# Patient Record
Sex: Female | Born: 1980 | Race: White | Hispanic: No | Marital: Married | Smoking: Former smoker
Health system: Southern US, Community
[De-identification: ages and names within clinical notes are randomized; demographics above are authoritative.]

## PROBLEM LIST (undated history)

## (undated) DIAGNOSIS — F32A Depression, unspecified: Secondary | ICD-10-CM

## (undated) DIAGNOSIS — F329 Major depressive disorder, single episode, unspecified: Secondary | ICD-10-CM

---

## 2001-12-19 ENCOUNTER — Other Ambulatory Visit: Admission: RE | Admit: 2001-12-19 | Discharge: 2001-12-19 | Payer: Self-pay | Admitting: Gynecology

## 2002-07-27 ENCOUNTER — Inpatient Hospital Stay (HOSPITAL_COMMUNITY): Admission: AD | Admit: 2002-07-27 | Discharge: 2002-07-28 | Payer: Self-pay | Admitting: Gynecology

## 2002-08-03 ENCOUNTER — Inpatient Hospital Stay (HOSPITAL_COMMUNITY): Admission: AD | Admit: 2002-08-03 | Discharge: 2002-08-07 | Payer: Self-pay | Admitting: Gynecology

## 2002-08-04 ENCOUNTER — Encounter (INDEPENDENT_AMBULATORY_CARE_PROVIDER_SITE_OTHER): Payer: Self-pay

## 2002-10-12 ENCOUNTER — Other Ambulatory Visit: Admission: RE | Admit: 2002-10-12 | Discharge: 2002-10-12 | Payer: Self-pay | Admitting: Gynecology

## 2003-02-05 ENCOUNTER — Emergency Department (HOSPITAL_COMMUNITY): Admission: EM | Admit: 2003-02-05 | Discharge: 2003-02-05 | Payer: Self-pay | Admitting: Emergency Medicine

## 2003-02-07 ENCOUNTER — Ambulatory Visit (HOSPITAL_BASED_OUTPATIENT_CLINIC_OR_DEPARTMENT_OTHER): Admission: RE | Admit: 2003-02-07 | Discharge: 2003-02-07 | Payer: Self-pay | Admitting: Urology

## 2003-02-07 ENCOUNTER — Ambulatory Visit (HOSPITAL_COMMUNITY): Admission: RE | Admit: 2003-02-07 | Discharge: 2003-02-07 | Payer: Self-pay | Admitting: Urology

## 2003-10-29 ENCOUNTER — Emergency Department (HOSPITAL_COMMUNITY): Admission: EM | Admit: 2003-10-29 | Discharge: 2003-10-29 | Payer: Self-pay | Admitting: Emergency Medicine

## 2003-10-30 ENCOUNTER — Emergency Department: Payer: Self-pay

## 2004-05-05 ENCOUNTER — Other Ambulatory Visit: Admission: RE | Admit: 2004-05-05 | Discharge: 2004-05-05 | Payer: Self-pay | Admitting: Gynecology

## 2005-11-10 ENCOUNTER — Other Ambulatory Visit: Admission: RE | Admit: 2005-11-10 | Discharge: 2005-11-10 | Payer: Self-pay | Admitting: Gynecology

## 2006-09-17 ENCOUNTER — Inpatient Hospital Stay (HOSPITAL_COMMUNITY): Admission: RE | Admit: 2006-09-17 | Discharge: 2006-09-19 | Payer: Self-pay | Admitting: Obstetrics and Gynecology

## 2008-01-24 ENCOUNTER — Ambulatory Visit (HOSPITAL_COMMUNITY): Admission: RE | Admit: 2008-01-24 | Discharge: 2008-01-24 | Payer: Self-pay | Admitting: Obstetrics and Gynecology

## 2008-06-12 ENCOUNTER — Encounter (INDEPENDENT_AMBULATORY_CARE_PROVIDER_SITE_OTHER): Payer: Self-pay | Admitting: Obstetrics and Gynecology

## 2008-06-12 ENCOUNTER — Inpatient Hospital Stay (HOSPITAL_COMMUNITY): Admission: RE | Admit: 2008-06-12 | Discharge: 2008-06-15 | Payer: Self-pay | Admitting: Obstetrics and Gynecology

## 2010-06-11 LAB — CBC
HCT: 23.9 % — ABNORMAL LOW (ref 36.0–46.0)
HCT: 25.4 % — ABNORMAL LOW (ref 36.0–46.0)
HCT: 33 % — ABNORMAL LOW (ref 36.0–46.0)
Hemoglobin: 8.4 g/dL — ABNORMAL LOW (ref 12.0–15.0)
Hemoglobin: 11.6 g/dL — ABNORMAL LOW (ref 12.0–15.0)
Hemoglobin: 8.9 g/dL — ABNORMAL LOW (ref 12.0–15.0)
MCHC: 34.9 g/dL (ref 30.0–36.0)
MCHC: 34.9 g/dL (ref 30.0–36.0)
MCHC: 35 g/dL (ref 30.0–36.0)
MCV: 97.2 fL (ref 78.0–100.0)
MCV: 95.1 fL (ref 78.0–100.0)
MCV: 95.7 fL (ref 78.0–100.0)
Platelets: 147 10*3/uL — ABNORMAL LOW (ref 150–400)
Platelets: 137 10*3/uL — ABNORMAL LOW (ref 150–400)
Platelets: 164 10*3/uL (ref 150–400)
RBC: 2.46 MIL/uL — ABNORMAL LOW (ref 3.87–5.11)
RBC: 2.65 MIL/uL — ABNORMAL LOW (ref 3.87–5.11)
RBC: 3.47 MIL/uL — ABNORMAL LOW (ref 3.87–5.11)
RDW: 13.5 % (ref 11.5–15.5)
RDW: 13.3 % (ref 11.5–15.5)
RDW: 13.5 % (ref 11.5–15.5)
WBC: 8.4 10*3/uL (ref 4.0–10.5)
WBC: 10.1 10*3/uL (ref 4.0–10.5)
WBC: 8.4 10*3/uL (ref 4.0–10.5)

## 2010-06-11 LAB — RPR: RPR Ser Ql: NONREACTIVE

## 2010-07-15 NOTE — Op Note (Signed)
NAMEKELISSA, MERLIN                ACCOUNT NO.:  [id number]   MEDICAL RECORD NO.:  [id number]          PATIENT TYPE:  INP   LOCATION:  9146                          FACILITY:  [facility]   PHYSICIAN:  Lenoard Aden, M.D.DATE OF BIRTH:  12-May-1980   DATE OF PROCEDURE:  09/17/2006  DATE OF DISCHARGE:                               OPERATIVE REPORT   PREOPERATIVE DIAGNOSIS:  Previous C-section at 39 weeks for elective  repeat.   POSTOPERATIVE DIAGNOSIS:  Previous C-section at 39 weeks for elective  repeat.   SURGEON:  Lenoard Aden, M.D.   ASSISTANT:  Marlinda Mike, C.N.M.   ANESTHESIA:  Spinal by Octaviano Glow. Pamalee Leyden, M.D.   ESTIMATED BLOOD LOSS:  1000 mL.   COMPLICATIONS:  None.   Placenta to L&D, Foley catheter to gravity drainage.  The patient  recovery in good condition.   After being apprised of risks of anesthesia, infection, bleeding, injury  to abdominal organs, need for repair, delayed versus immediate  complications, including bowel and bladder injury.  The patient brought  to the operating where she administered spinal anesthetic without  complications, prepped, draped in usual sterile fashion.  Foley catheter  placed.  After achieving adequate anesthesia, a dilute Marcaine solution  placed.  A Pfannenstiel skin incision was made with scalpel, carried  down to fascia which nicked in the midline, __________ Mayo scissors.  Rectus muscles dissected sharply in midline, peritoneum entered sharply.  Bladder blade placed.  Visceral peritoneum scored sharply __________ a  segment curved hysterotomy incision made.  Atraumatic delivery of a full-  term living female handed to pediatricians in attendance.  Apgars of 8  and 9.  Cord blood collected.  Placenta delivered manually intact from a  posterior location uterus.  Curetted using dry lap pack and closed with  two running imbricating layers of 0-0 Monocryl suture.  A right  __________ stitch is placed for bleeding at the  right lateral edge, good  hemostasis noted.  Normal tubes, normal ovaries noted.  Good hemostasis  was accomplished.  Bladder flap inspected, found to be hemostatic.  Fascia then closed using a 0-0 Monocryl in subcuticular fashion.  Skin  old scar was removed, subcutaneous tissue undermined, subcutaneous  tissue was reapproximated using 0-0 plain suture in interrupted fashion.  Skin closed using staples.  The patient tolerated procedure well and  transferred to recovery in good condition.      Lenoard Aden, M.D.  Electronically Signed    RJT/MEDQ  D:  09/17/2006  T:  09/18/2006  Job:  [id number]

## 2010-07-15 NOTE — Op Note (Signed)
Caitlin Brown, Caitlin Brown                ACCOUNT NO.:  [id number]   MEDICAL RECORD NO.:  [id number]          PATIENT TYPE:  INP   LOCATION:  9126                          [facility]   PHYSICIAN:  Lenoard Aden, M.D.DATE OF BIRTH:  1981-02-24   DATE OF PROCEDURE:  06/12/2008  DATE OF DISCHARGE:                               OPERATIVE REPORT   PREOPERATIVE DIAGNOSES:  1. 39 weeks.  2. Previous C-section x2.  3. Desire for elective sterilization.   POSTOPERATIVE DIAGNOSES:  1. 39 weeks.  2. Previous C-section x2.  3. Desire for elective sterilization.   PROCEDURES:  1. Repeat low segment transverse cesarean section.  2. Tubal ligation.   SURGEON:  Lenoard Aden, MD   ANESTHESIA:  Spinal.   ANESTHESIOLOGIST:  Quillian Quince, MD   ESTIMATED BLOOD LOSS:  1000 mL.   COMPLICATIONS:  None.   DRAINS:  Foley.   COUNTS:  Correct.   DISPOSITION:  The patient to recovery in good condition.   ASSISTANT:  Merrilee Jansky, CNM   FINDINGS:  Full-term living female, occiput transverse position, vacuum-  assisted delivery, one pull, Apgars 8 and 9.  Normal tubes.  Normal  ovaries.  One layer uterine closure.  Uncomplicated tubal ligation.   BRIEF OPERATIVE NOTE:  After being apprised of risks of anesthesia,  infection, bleeding, injury to abdominal organs and need for repair,  delayed versus immediate complications to include bowel and bladder  injury, and failure risk of tubal ligation of 5 to 10 per 1000, the  patient was brought to the operating room where she was administered a  spinal anesthetic without complications, prepped and draped in usual  sterile fashion.  Foley catheter was placed.  After achieving adequate  anesthesia, dilute Marcaine solution was placed.  A Pfannenstiel skin  incision was made with scalpel and carried down to fascia, which was  nicked in the midline and opened transversely using Mayo scissors.  Rectus muscles were dissected sharply in the midline.   Peritoneum was  entered sharply.  Bladder blade was placed.  Visceral peritoneum was  scored sharply off the lower uterine segment.  Kerr hysterotomy incision  was made and extended in cephalad and caudad bluntly.  A amniotomy clear  fluid, vacuum-assisted delivery, one pull, full-term living female handed  to pediatrician's attendants as noted.  Apgars as noted.  Cord blood  collected.  Placenta delivered manually intact from posterior location,  sent to L&D.  Uterus exteriorized, curetted using a dry lap pack and  closed in one running layer of 0 Monocryl suture.  Good hemostasis was  noted.  Left tube traced out to the fimbriated end and ampullary-isthmic  portion of the tube was identified.  Avascular portion of the  mesosalpinx was cauterized creating a window.  Distal and proximal 0  plain ties were placed without difficulty.  Tubal segment excised and  sent to Pathology.  Same procedure done on the left tube was done on the  right tube.  Tubal segments sent to Pathology.  Good hemostasis noted.  Tubal lumens visualized and cauterized in all four  areas.  After  replacing uterus in the abdominal cavity, irrigation accomplished.  Good  hemostasis noted.  Bladder flaps found to be hemostatic.  Uterine  incision hemostatic.  Fascia then closed using 0 Monocryl in a  continuous running fashion.  Skin was closed using staples.  The patient  tolerated the procedure well and transferred to recovery in good  condition.      Lenoard Aden, M.D.  Electronically Signed     RJT/MEDQ  D:  06/12/2008  T:  06/13/2008  Job:  [id number]

## 2010-07-18 NOTE — Discharge Summary (Signed)
NAMEJAYLEI, Caitlin Brown                ACCOUNT NO.:  [id number]   MEDICAL RECORD NO.:  [id number]          PATIENT TYPE:  INP   LOCATION:  9126                          [facility]  [facility]   PHYSICIAN:  Lenoard Aden, M.D.DATE OF BIRTH:  Jan 11, 1981   DATE OF ADMISSION:  06/12/2008  DATE OF DISCHARGE:  06/15/2008                               DISCHARGE SUMMARY   ADMITTING DIAGNOSIS:  A 39 weeks for repeat cesarean section.    The patient is admitted on June 12, 2008, for repeat cesarean section.  The patient has received prenatal care at [facility] since [redacted] weeks  gestation with Dr. Billy Coast as primary.  Significant prenatal history  includes a previous cesarean section.  The patient is O positive,  antibody screen negative, rubella is positive, hepatitis B negative, RPR  negative, HIV negative.   The patient has no known drug allergies.   Medical history is significant for kidney stones and family history is  significant for diabetes and breast cancer.   Preop CBC, white blood cell count is 8.4, hemoglobin 11.6, hematocrit  33, platelet count of 164.   The patient underwent cesarean section with Dr. Billy Coast on June 12, 2008, of a female newborn.  Postoperative course, CBC, white blood cell  count is 10.1, hemoglobin 8.9, hematocrit 25.4, platelet count of 137.  Repeat CBC on day of discharge on June 15, 2008, white blood cell count  8.4, hemoglobin stable at 8.4, hematocrit 23.9, and a platelet count of  147.  The patient has remained afebrile during hospital course.  Vital  signs on day of discharge is 97.9, 66, 18, 102/59.  Incision is  approximated with staples with marked ecchymosis below the incision over  vulva and labia bilaterally.  Staples are left intact to return to  [facility] on Tuesday for incision evaluation and staple removal.   POSTOPERATIVE CARE:  Routine postoperative care per [facility] [employer] booklet.   Medications at the time of discharge include:  1.  Prenatal vitamin once p.o. daily.  2. Niferex 150 mg 1 p.o. daily.  3. Percocet 1-2 tablets p.o. q.6 h. as needed for pain.  4. Keflex 500 mg p.o. b.i.d. secondary to marked ecchymosis, risk of      wound infection secondary to hematoma.   The patient is discharged in stable condition to follow up a schedule  with [facility] on Tuesday.      Marlinda Mike, C.N.M.      Lenoard Aden, M.D.  Electronically Signed    TB/MEDQ  D:  06/18/2008  T:  06/19/2008  Job:  [id number]

## 2010-07-18 NOTE — Discharge Summary (Signed)
Caitlin Brown, Caitlin Brown                ACCOUNT NO.:  [id number]   MEDICAL RECORD NO.:  [id number]          PATIENT TYPE:  INP   LOCATION:  9146                          [facility]   PHYSICIAN:  Lenoard Aden, M.D.DATE OF BIRTH:  [birth year]   DATE OF ADMISSION:  09/17/2006  DATE OF DISCHARGE:  09/19/2006                               DISCHARGE SUMMARY   The patient underwent an uncomplicated low segment transverse cesarean  section on September 17, 2006.  Postoperative course uncomplicated.  Discharged to home on day #3.  Discharge teaching done.  Tylox, prenatal  vitamins, and iron given.  Followup in the office in 4-6 weeks.      Lenoard Aden, M.D.  Electronically Signed     RJT/MEDQ  D:  11/03/2006  T:  11/04/2006  Job:  [id number]

## 2010-07-18 NOTE — Op Note (Signed)
Caitlin Brown, Caitlin Brown                            ACCOUNT NO.:  [id number]   MEDICAL RECORD NO.:  [id number]                   PATIENT TYPE:  AMB   LOCATION:  NESC                                 FACILITY:  [facility]   PHYSICIAN:  Sigmund I. Patsi Sears, M.D.         DATE OF BIRTH:  Aug 07, 1980   DATE OF PROCEDURE:  02/07/2003  DATE OF DISCHARGE:                                 OPERATIVE REPORT   PREOPERATIVE DIAGNOSIS:  Impacted right lower ureteral calculus (7 mm).   POSTOPERATIVE DIAGNOSIS:  Impacted right lower ureteral calculus (7 mm).   PROCEDURES:  Cystourethroscopy, right retrograde pyelogram, balloon dilation  of the right lower ureter (10 cm), laser fractionation of right lower  ureteral calculus, basket extraction of stone fragments, right double J  catheter (6 French x 26 cm).   PREPARATION:  After appropriate preanesthesia, the patient is brought to the  operating room and placed on the operating table in a dorsal supine  position, where general LMA anesthesia was introduced.  She was then  replaced in the dorsal lithotomy position, where the pubis was prepped with  Betadine solution and draped in the usual fashion.   DESCRIPTION OF PROCEDURE:  KUB shows that the patient's impacted stone is in  the right lower ureter.  The patient's history is that of  30 year old  female with right flank pain and right lower quadrant pain and CT scan  showing a 7 mm impacted right lower ureteral calculus.  She has a past  history of rheumatic fever at age 81.  There is no family history for stones.   Following retrograde pyelogram, ureteroscopy was performed but could not get  past the stone.  A guidewire was manipulated around the stone and then a 10  cm balloon dilator was used to balloon-dilate the lower ureter for three  minutes at 14 atmospheres of pressure.  Following this, the ureteroscope was  then passed into the ureter, and the stone was identified.  The laser  fractionation of the  stone was accomplished, and the stone basket was used  to remove the fragments.  Following this, the double J catheter was placed  with a coil in the kidney and a coil in the bladder.  Minimal bleeding was  noted during the procedure.  The patient tolerated the procedure well.  Photo documentation was accomplished.  She was given IV Toradol prior to  waking.                                               Sigmund I. Patsi Sears, M.D.    SIT/MEDQ  D:  02/07/2003  T:  02/07/2003  Job:  [id number]

## 2010-07-18 NOTE — Discharge Summary (Signed)
   Caitlin Brown, Caitlin Brown                            ACCOUNT NO.:  [id number]   MEDICAL RECORD NO.:  [id number]                   PATIENT TYPE:  INP   LOCATION:  9164                                 [facility]ILITY[facility]   PHYSICIAN:  Ivor Costa. Farrel Gobble, M.D.              DATE OF BIRTH:  1980/12/31   DATE OF ADMISSION:  07/27/2002  DATE OF DISCHARGE:  07/28/2002                                 DISCHARGE SUMMARY   DISCHARGE DIAGNOSES:  1. Intrauterine pregnancy 39+ weeks, undelivered.  2. Probable gastroenteritis.   HISTORY:  A 22-years-of-age female gravida 1 para 0 with an EDC of August 02, 2002.  Prenatal course had been complicated by short umbilical cord that was  noted at 18-week ultrasound with follow-up antepartum testing because of  increased risk of cord accident and fetal loss.  The follow-up ultrasound  did show better umbilical cord coiling.  She was seen in the office for  routine OB visit.  The patient had had severe nausea, vomiting, and  diarrhea, she was contracting, and the patient was admitted for observation.   HOSPITAL COURSE:  On Jul 27, 2002 the patient was admitted at 39+ weeks with  viral syndrome versus early prodromal labor, was given IV hydration, IV  antiemetics, and was monitored to see if she went into spontaneous labor on  her own.  On Jul 28, 2002 the patient was feeling much better, only having  one episode of diarrhea that a.m., nausea was gone, tolerating p.o. food.  Contractions were all but gone as well.  Therefore, the patient was felt  stable for discharge with the diagnosis of probable gastroenteritis and  routine instructions.  She was continued on a bland diet.  She was to follow  up as already scheduled for induction the next week secondary to short cord  with poor coiling.     Susa Loffler, P.A.                    Ivor Costa. Farrel Gobble, M.D.    TSG/MEDQ  D:  09/25/2002  T:  09/25/2002  Job:  [id number]

## 2010-07-18 NOTE — Discharge Summary (Signed)
   Caitlin Brown, Caitlin Brown                            ACCOUNT NO.:  [id number]   MEDICAL RECORD NO.:  [id number]                   PATIENT TYPE:  INP   LOCATION:  9112                                 [facility]   PHYSICIAN:  Timothy P. Fontaine, M.D.           DATE OF BIRTH:  October 10, 1980   DATE OF ADMISSION:  08/03/2002  DATE OF DISCHARGE:  08/07/2002                                 DISCHARGE SUMMARY   DISCHARGE DIAGNOSES:  1. Intrauterine pregnancy at term.  2. Induction.  3. Primary cesarean section.   PROCEDURE:  Primary cesarean section.   HISTORY OF PRESENT ILLNESS:  A 30 year old gravida 1 with an EDC of August 02, 2002 with estimated gestational age of [redacted] weeks, induced for a short cord  and straight cord, asymmetrical IUGR.  She did have reassuring NSTs and AFIs  in the office.  She did have no other problem.  She was induced for the  short cord.   LABORATORIES:  Blood type is O positive.  Antibody negative.  Serology  nonreactive.  Rubella titer immune.  Hepatitis and HIV were nonreactive.  Group B strep was negative.   HOSPITAL COURSE AND TREATMENT:  The patient presented on September 02, 2002 for an  induction for short cord and IUGR.  She had high dose Pitocin.  She had  deceleration and then had a primary C-section and a delivery of a viable  female with Apgars of 9 and 9 and a birth weight of 6 pounds 7 ounces.  Postpartum, she remained afebrile, voiding and did well.  She was discharged  home on her third postop day in satisfactory condition.  Prescription for  Tylox.  Continue prenatal vitamins.  Follow up in the office in six weeks.  She was given discharge booklet.   DISCHARGE LABORATORIES:  White count 23.5.  Hemoglobin 10.3.  Hematocrit  29.4.  Platelets 181.     Susa Loffler, P.A.                    Timothy P. Fontaine, M.D.    TSG/MEDQ  D:  09/18/2002  T:  09/18/2002  Job:  [id number]

## 2010-07-18 NOTE — Discharge Summary (Signed)
   NAMEGURBANI, FIGGE                            ACCOUNT NO.:  [id number]   MEDICAL RECORD NO.:  [id number]                   PATIENT TYPE:  INP   LOCATION:  9112                                 [facility]   PHYSICIAN:  Ivor Costa. Farrel Gobble, M.D.              DATE OF BIRTH:  08-17-1980   DATE OF ADMISSION:  08/03/2002  DATE OF DISCHARGE:  08/07/2002                                 DISCHARGE SUMMARY   DISCHARGE DIAGNOSES:  Intrauterine pregnancy at term for a scheduled  induction.   PROCEDURE:  Primary cesarean section for nonreassuring fetal heart tones.   HISTORY OF PRESENT ILLNESS:  A 30 year old gravida 1, para 0 whose prenatal  course was complicated for a questionable nonsymmetrical IUGR and a short  cord noted on ultrasound.  Her LMP was October 22, 2001 and her [facility] was August 02, 2002.  She was 40-1/7 weeks.   LABORATORIES:  Blood type O+.  Antibody negative.  Serology nonreactive.  Rubella immune.  Hepatitis nonreactive.  HIV nonreactive.  GBS negative.   HOSPITAL COURSE:  The patient presented on August 03, 2002 for scheduled  induction from home.  She had nonreassuring fetal heart tones during labor.  She then had a primary cesarean section and had a viable female with Apgars  of 9 and 9 with a birth weight of 6 pounds 7 ounces.  Postpartum she did  well.  She had no difficulty with voiding, remained afebrile.  Lochia was  moderate.  She was discharged home in satisfactory condition on  postoperative day three with instructions.   POSTPARTUM LABORATORIES:  White count 23.5, hemoglobin 10.3, hematocrit  29.4, platelets 181,000.   DISPOSITION:  She was discharged to home with instructions to follow up in  the office six weeks or as needed.  [facility] discharge booklet.  Prescription for  Tylox was given.  Staples were removed and Steri-Strips applied.     Davonna Belling. Young, N.P.                      Ivor Costa. Farrel Gobble, M.D.    [place] Lanius  D:  08/23/2002  T:  08/23/2002  Job:   [id number]

## 2010-07-18 NOTE — Op Note (Signed)
Caitlin Brown, Caitlin Brown                            ACCOUNT NO.:  [id number]   MEDICAL RECORD NO.:  [id number]                   PATIENT TYPE:  INP   LOCATION:  9112                                 [facility]   PHYSICIAN:  Timothy P. Fontaine, M.D.           DATE OF BIRTH:  [birth year]   DATE OF PROCEDURE:  08/04/2002  DATE OF DISCHARGE:                                 OPERATIVE REPORT   PREOPERATIVE DIAGNOSES:  1. Pregnancy at term.  2. Nonreassuring fetal tracing.   POSTOPERATIVE DIAGNOSES:  1. Pregnancy at term.  2. Nonreassuring fetal tracing.   PROCEDURE:  Primary low transverse cervical cesarean section.   SURGEON:  Timothy P. Fontaine, M.D.   ASSISTANT:  Scrub technician.   ANESTHESIA:  Epidural.   ESTIMATED BLOOD LOSS:  Less than 500 cc.   COMPLICATIONS:  None.   SPECIMENS:  Cord blood, placenta.   FINDINGS:  At 90 normal female, Apgars 9 and 9; weighing 6 pounds 7  ounces.  Uterus was not exteriorized.  Adnexa was palpated and palpated  normal, though not visualized.   DESCRIPTION OF PROCEDURE:  The patient was taken to the operating room, had  her epidural catheter redosed.  The patient placed then in supine position  and received an abdominal preparation with Betadine solution; was draped in  the usual fashion, having a Foley catheter already placed on labor and  delivery.  After assuring adequate anesthesia, the abdomen was sharply  entered through a Pfannenstiel incision, achieving adequate hemostasis at  all levels.  The bladder flap was sharply and bluntly developed without  difficulty.  The uterus was sharply entered in the lower uterine segment,  and bluntly extended laterally.  The membranes were ruptured, the fluid  noted to be clear.  The infant's head was delivered to the level of the  incision.  Initial attempt at vacuum extractor was made, and due to  malfunction of the vacuum short Simpson forceps were used to deliver the  vertex without  difficulty.  The nares and mouth were suctioned.  The rest of  the infant was delivered.  The cord was doubly clamped and cut, and the  infant was handed to pediatrics in attendance.  Samples of cord blood were  obtained.  The placenta was then spontaneously extruded and noted to be  intact.  Initial attempt at exteriozation of the uterus showed that this was  a snug fit, and it was decided to leave the uterus in situ.   The uterine cavity was explored with a sponge to remove all placental  membrane fragments.  The bladder blade was replaced, and the uterine  incision was identified with ring forceps at  both lateral aspects, as well as the superior and inferior edges.  Using a 0  Vicryl suture, the uterine incision was closed in a running interlocking  stitch, pacing both ends; and subsequently an embrocating suture using 0  Vicryl suture.  The pelvis was irrigated  Adequate hemostasis was  visualized.  The tags were kept free.  The anterior fascia was  reapproximated using 0-Vicryl suture in a running stitch.  Subcutaneous  tissues were irrigated.  Adequate hemostasis was achieved with  electrocautery.  Skin reapproximated with staples and intervening Steri-  Strips with Benzoin.  Sterile dressing was applied and the patient was taken  to the recovery room in good condition, having tolerated the procedure well.                                               Timothy P. Audie Box, M.D.    TPF/MEDQ  D:  08/04/2002  T:  08/05/2002  Job:  [id number]

## 2010-07-18 NOTE — Discharge Summary (Signed)
NAMEJAXSYN, CATALFAMO                ACCOUNT NO.:  [id number]   MEDICAL RECORD NO.:  [id number]          PATIENT TYPE:  INP   LOCATION:  9126                          [facility]   PHYSICIAN:  Lenoard Aden, M.D.DATE OF BIRTH:  [birth year]   DATE OF ADMISSION:  06/12/2008  DATE OF DISCHARGE:  06/15/2008                               DISCHARGE SUMMARY   ADMISSION DIAGNOSIS:  Previous cesarean section, repeat.   DISCHARGE DIAGNOSIS:  Previous cesarean section, repeat.   HOSPITAL COURSE:  The patient underwent uncomplicated repeat low segment  transverse cesarean section.  Postoperative course uncomplicated.  Tolerated diet well.  Discharge teaching done.  Discharged home day 3.   DISCHARGE MEDICATIONS:  Tylox, prenatal vitamins, and iron.   Follow up in the office in 4-6 weeks.      Lenoard Aden, M.D.  Electronically Signed     RJT/MEDQ  D:  07/05/2008  T:  07/06/2008  Job:  [id number]

## 2010-07-18 NOTE — H&P (Signed)
Caitlin Brown, HUMPHRES                            ACCOUNT NO.:  [id number]   MEDICAL RECORD NO.:  [id number]                   PATIENT TYPE:  INP   LOCATION:  9164                                 FACILITY:  [facility]   PHYSICIAN:  Ivor Costa. Farrel Gobble, M.D.              DATE OF BIRTH:  Aug 24, 1980   DATE OF ADMISSION:  07/27/2002  DATE OF DISCHARGE:                                HISTORY & PHYSICAL   HISTORY OF PRESENT ILLNESS:  The patient is a 30 year old G1 with a LMP of  October 24, 2001, an estimated date of confinement of August 02, 2002, and  estimated gestational age of 39-1/7 weeks whose pregnancy has been  complicated by a poorly coiled cord that was noted on an 18 week ultrasound  and as a result of that, had been followed up with antepartum testing  because of increased risk of cord axon and fetal loss. Although followup  ultrasound did show better coiling.   The patient was seen in the office yesterday for routine OB visit. Fundal  height was noted to be 35 which it had been on her last few visits. Her  cervix was short and closed; however, was still -3 station. The patient was  asked to return back to the office for an estimated fetal weight and an AFI.  She returned back to the office today. She is noted to have an estimated  fetal weight of 6 pounds 12 ounces, which is the 16th percentile with an AFI  of 14.2, which is in the 55th percentile. Of note, we had difficulty  scanning her today because the patient was having severe nausea and  difficulty obtaining a BPD and AC as a result thereof.   The patient states that she had been doing very well until this afternoon  when she developed nausea, vomiting and diarrhea. Of note, she started  contracting last evening about every ten minutes. Now she has felt that  those have since resolved but she is still, in general, not feeling well.  Examination today shows her external os to be a dimple where it had been  closed. However, the cervix  is still short, closed, and soft. The patient  reports good fetal movement. Denies any headache or right upper quadrant  pain. No visual changes.   Her blood type is 0+. Antibody is negative. RPR nonreactive. Rubella immune.  Hepatitis B surface antigen nonreactive. HIV nonreactive. AFP was declined  and the GBS was negative. Please refer to the [facility].   PHYSICAL EXAMINATION:  GENERAL:  She is a slightly ill appearing gravida in  no acute distress.  HEART:  Regular rate.  LUNGS:  Clear to auscultation.  ABDOMEN:  Gravid, soft, nontender with a fundal height of 35 and heart tones  were auscultated.  PELVIC:  Vaginal examination is externally a dimple, short, closed and soft.  EXTREMITIES:  No clubbing, cyanosis, or  edema.   ASSESSMENT:  Viral syndrome versus early prodromal labor.   PLAN:  Will admit the patient for observation with IV hydration, IV anti-  emetics and see if she does not go into a spontaneous labor on her own. She  was agreeable and was admitted to labor and delivery.                                               Ivor Costa. Farrel Gobble, M.D.    THL/MEDQ  D:  07/27/2002  T:  07/27/2002  Job:  [id number]

## 2010-12-15 LAB — CBC
HCT: 29.6 — ABNORMAL LOW
HCT: 34.9 — ABNORMAL LOW
Hemoglobin: 10.4 — ABNORMAL LOW
Hemoglobin: 12
MCHC: 34.5
MCHC: 35
MCV: 93.3
MCV: 93.8
Platelets: 151
Platelets: 178
RBC: 3.16 — ABNORMAL LOW
RBC: 3.74 — ABNORMAL LOW
RDW: 13.1
RDW: 13.3
WBC: 10.5
WBC: 7.8

## 2010-12-15 LAB — SAMPLE TO BLOOD BANK

## 2010-12-15 LAB — RPR: RPR Ser Ql: NONREACTIVE

## 2014-01-07 ENCOUNTER — Encounter: Payer: Self-pay | Admitting: *Deleted

## 2015-04-13 ENCOUNTER — Emergency Department (HOSPITAL_COMMUNITY)
Admission: EM | Admit: 2015-04-13 | Discharge: 2015-04-13 | Disposition: A | Discharge status: Home / Self Care | Payer: Self-pay | Attending: Emergency Medicine | Admitting: Emergency Medicine

## 2015-04-13 ENCOUNTER — Encounter (HOSPITAL_COMMUNITY): Payer: Self-pay | Admitting: Emergency Medicine

## 2015-04-13 DIAGNOSIS — F10120 Alcohol abuse with intoxication, uncomplicated: Secondary | ICD-10-CM | POA: Insufficient documentation

## 2015-04-13 DIAGNOSIS — F10929 Alcohol use, unspecified with intoxication, unspecified: Secondary | ICD-10-CM

## 2015-04-13 DIAGNOSIS — Z87891 Personal history of nicotine dependence: Secondary | ICD-10-CM | POA: Insufficient documentation

## 2015-04-13 DIAGNOSIS — Z3202 Encounter for pregnancy test, result negative: Secondary | ICD-10-CM | POA: Insufficient documentation

## 2015-04-13 LAB — ETHANOL: Alcohol, Ethyl (B): 336 mg/dL (ref <5)

## 2015-04-13 LAB — COMPREHENSIVE METABOLIC PANEL
ALT: 18 U/L (ref 14–54)
AST: 24 U/L (ref 15–41)
Albumin: 4.6 g/dL (ref 3.5–5.0)
Alkaline Phosphatase: 36 U/L — ABNORMAL LOW (ref 38–126)
Anion gap: 11 (ref 5–15)
BUN: 8 mg/dL (ref 6–20)
CO2: 22 mmol/L (ref 22–32)
Calcium: 8.3 mg/dL — ABNORMAL LOW (ref 8.9–10.3)
Chloride: 106 mmol/L (ref 101–111)
Creatinine, Ser: 0.8 mg/dL (ref 0.44–1.00)
GFR calc Af Amer: >60 mL/min (ref >60)
GFR calc non Af Amer: >60 mL/min (ref >60)
Glucose, Bld: 88 mg/dL (ref 65–99)
Potassium: 3.9 mmol/L (ref 3.5–5.1)
Sodium: 139 mmol/L (ref 135–145)
Total Bilirubin: 0.3 mg/dL (ref 0.3–1.2)
Total Protein: 7.3 g/dL (ref 6.5–8.1)

## 2015-04-13 LAB — CBC
HCT: 35.8 % — ABNORMAL LOW (ref 36.0–46.0)
Hemoglobin: 11.9 g/dL — ABNORMAL LOW (ref 12.0–15.0)
MCH: 31.4 pg (ref 26.0–34.0)
MCHC: 33.2 g/dL (ref 30.0–36.0)
MCV: 94.5 fL (ref 78.0–100.0)
Platelets: 211 10*3/uL (ref 150–400)
RBC: 3.79 MIL/uL — ABNORMAL LOW (ref 3.87–5.11)
RDW: 12.7 % (ref 11.5–15.5)
WBC: 4.5 10*3/uL (ref 4.0–10.5)

## 2015-04-13 LAB — RAPID URINE DRUG SCREEN, HOSP PERFORMED
Amphetamines: NOT DETECTED
Barbiturates: NOT DETECTED
Benzodiazepines: NOT DETECTED
Cocaine: NOT DETECTED
Opiates: NOT DETECTED
Tetrahydrocannabinol: NOT DETECTED

## 2015-04-13 LAB — ACETAMINOPHEN LEVEL: Acetaminophen (Tylenol), Serum: <10 ug/mL — ABNORMAL LOW (ref 10–30)

## 2015-04-13 LAB — SALICYLATE LEVEL: Salicylate Lvl: <4 mg/dL (ref 2.8–30.0)

## 2015-04-13 LAB — PREGNANCY, URINE: Preg Test, Ur: NEGATIVE

## 2015-04-13 NOTE — ED Notes (Signed)
Pt. Ambulated in hallway without difficulty.  

## 2015-04-13 NOTE — ED Provider Notes (Signed)
CSN: [id number]     Arrival date & time 04/13/15  0244 History   First MD Initiated Contact with Patient 04/13/15 0250     Chief Complaint  Patient presents with  . Alcohol Intoxication     (Consider location/radiation/quality/duration/timing/severity/associated sxs/prior Treatment) HPI Comments: Patient arrives to the ED via EMS after being called by an Therapist, art.  The catheter reset.  He picked the patient up at a shop and when he arrived at her destination was unable to arouse her.  He called EMS for transport to the emergency department  Patient is a 35 y.o. female presenting with intoxication. The history is provided by the EMS personnel. The history is limited by the condition of the patient.  Alcohol Intoxication    History reviewed. No pertinent past medical history. Past Surgical History  Procedure Laterality Date  . Cesarean section     History reviewed. No pertinent family history. Social History  Substance Use Topics  . Smoking status: Former Games developer  . Smokeless tobacco: None  . Alcohol Use: No   OB History    No data available     Review of Systems  Unable to perform ROS: Patient unresponsive      Allergies  Review of patient's allergies indicates no known allergies.  Home Medications   Prior to Admission medications   Not on File   BP 103/62 mmHg  Pulse 86  Temp(Src) 97.2 F (36.2 C) (Axillary)  Resp 14  SpO2 100% Physical Exam  Constitutional: She appears well-developed and well-nourished.  HENT:  Head: Normocephalic.  Eyes: Pupils are equal, round, and reactive to light.  Cardiovascular: Normal rate and regular rhythm.   Pulmonary/Chest: Effort normal and breath sounds normal.  Abdominal: Bowel sounds are normal. She exhibits no distension. There is no tenderness.  Musculoskeletal: Normal range of motion.  Neurological:  Arouses to deep sternal stimulation  Skin: Skin is warm and dry.  Nursing note and vitals reviewed.   ED Course   Procedures (including critical care time) Labs Review Labs Reviewed  COMPREHENSIVE METABOLIC PANEL - Abnormal; Notable for the following:    Calcium 8.3 (*)    Alkaline Phosphatase 36 (*)    All other components within normal limits  ETHANOL - Abnormal; Notable for the following:    Alcohol, Ethyl (B) 336 (*)    All other components within normal limits  CBC - Abnormal; Notable for the following:    RBC 3.79 (*)    Hemoglobin 11.9 (*)    HCT 35.8 (*)    All other components within normal limits  ACETAMINOPHEN LEVEL - Abnormal; Notable for the following:    Acetaminophen (Tylenol), Serum <10 (*)    All other components within normal limits  URINE RAPID DRUG SCREEN, HOSP PERFORMED  PREGNANCY, URINE  SALICYLATE LEVEL    Imaging Review No results found. I have personally reviewed and evaluated these images and lab results as part of my medical decision-making.   EKG Interpretation None     Patient is now alert and oriented.  She is in the hall.  She asked without difficulty independently.  Her significant other is here and will escort her home MDM   Final diagnoses:  Alcohol intoxication, with unspecified complication (HCC)         Earley Favor, NP 04/13/15 4098  Rolan Bucco, MD 04/13/15 [phone]

## 2015-04-13 NOTE — Discharge Instructions (Signed)
Alcohol Intoxication Alcohol intoxication occurs when the amount of alcohol that a person has consumed impairs his or her ability to mentally and physically function. Alcohol directly impairs the normal chemical activity of the brain. Drinking large amounts of alcohol can lead to changes in mental function and behavior, and it can cause many physical effects that can be harmful.  Alcohol intoxication can range in severity from mild to very severe. Various factors can affect the level of intoxication that occurs, such as the person's age, gender, weight, frequency of alcohol consumption, and the presence of other medical conditions (such as diabetes, seizures, or heart conditions). Dangerous levels of alcohol intoxication may occur when people drink large amounts of alcohol in a short period (binge drinking). Alcohol can also be especially dangerous when combined with certain prescription medicines or "recreational" drugs. SIGNS AND SYMPTOMS Some common signs and symptoms of mild alcohol intoxication include:  Loss of coordination.  Changes in mood and behavior.  Impaired judgment.  Slurred speech. As alcohol intoxication progresses to more severe levels, other signs and symptoms will appear. These may include:  Vomiting.  Confusion and impaired memory.  Slowed breathing.  Seizures.  Loss of consciousness. DIAGNOSIS  Your health care provider will take a medical history and perform a physical exam. You will be asked about the amount and type of alcohol you have consumed. Blood tests will be done to measure the concentration of alcohol in your blood. In many places, your blood alcohol level must be lower than 80 mg/dL (4.13%0.08%) to legally drive. However, many dangerous effects of alcohol can occur at much lower levels.  TREATMENT  People with alcohol intoxication often do not require treatment. Most of the effects of alcohol intoxication are temporary, and they go away as the alcohol naturally  leaves the body. Your health care provider will monitor your condition until you are stable enough to go home. Fluids are sometimes given through an IV access tube to help prevent dehydration.  HOME CARE INSTRUCTIONS  Do not drive after drinking alcohol.  Stay hydrated. Drink enough water and fluids to keep your urine clear or pale yellow. Avoid caffeine.   Only take over-the-counter or prescription medicines as directed by your health care provider.  SEEK MEDICAL CARE IF:   You have persistent vomiting.   You do not feel better after a few days.  You have frequent alcohol intoxication. Your health care provider can help determine if you should see a substance use treatment counselor. SEEK IMMEDIATE MEDICAL CARE IF:   You become shaky or tremble when you try to stop drinking.   You shake uncontrollably (seizure).   You throw up (vomit) blood. This may be bright red or may look like black coffee grounds.   You have blood in your stool. This may be bright red or may appear as a black, tarry, bad smelling stool.   You become lightheaded or faint.  MAKE SURE YOU:   Understand these instructions.  Will watch your condition.  Will get help right away if you are not doing well or get worse.   This information is not intended to replace advice given to you by your health care provider. Make sure you discuss any questions you have with your health care provider.   Document Released: 11/26/2004 Document Revised: 10/19/2012 Document Reviewed: 07/22/2012 Elsevier Interactive Patient Education 2016 Elsevier Inc.  Chemical Dependency Chemical dependency is an addiction to drugs or alcohol. It is characterized by the repeated behavior of seeking out  and using drugs and alcohol despite harmful consequences to the health and safety of ones self and others.  RISK FACTORS There are certain situations or behaviors that increase a person's risk for chemical dependency. These  include:  A family history of chemical dependency.  A history of mental health issues, including depression and anxiety.  A home environment where drugs and alcohol are easily available to you.  Drug or alcohol use at a young age. SYMPTOMS  The following symptoms can indicate chemical dependency:  Inability to limit the use of drugs or alcohol.  Nausea, sweating, shakiness, and anxiety that occurs when alcohol or drugs are not being used.  An increase in amount of drugs or alcohol that is necessary to get drunk or high. People who experience these symptoms can assess their use of drugs and alcohol by asking themselves the following questions:  Have you been told by friends or family that they are worried about your use of alcohol or drugs?  Do friends and family ever tell you about things you did while drinking alcohol or using drugs that you do not remember?  Do you lie about using alcohol or drugs or about the amounts you use?  Do you have difficulty completing daily tasks unless you use alcohol or drugs?  Is the level of your work or school performance lower because of your drug or alcohol use?  Do you get sick from using drugs or alcohol but keep using anyway?  Do you feel uncomfortable in social situations unless you use alcohol or drugs?  Do you use drugs or alcohol to help forget problems? An answer of yes to any of these questions may indicate chemical dependency. Professional evaluation is suggested.   This information is not intended to replace advice given to you by your health care provider. Make sure you discuss any questions you have with your health care provider.   Document Released: 02/10/2001 Document Revised: 05/11/2011 Document Reviewed: 04/24/2010 Elsevier Interactive Patient Education 2016 [employer].  Emergency Department Resource Guide 1) Find a Doctor and Pay Out of Pocket Although you won't have to find out who is covered by your insurance  plan, it is a good idea to ask around and get recommendations. You will then need to call the office and see if the doctor you have chosen will accept you as a new patient and what types of options they offer for patients who are self-pay. Some doctors offer discounts or will set up payment plans for their patients who do not have insurance, but you will need to ask so you aren't surprised when you get to your appointment.  2) Contact Your Local Health Department Not all health departments have doctors that can see patients for sick visits, but many do, so it is worth a call to see if yours does. If you don't know where your local health department is, you can check in your phone book. The CDC also has a tool to help you locate your state's health department, and many state websites also have listings of all of their local health departments.  3) Find a Walk-in Clinic If your illness is not likely to be very severe or complicated, you may want to try a walk in clinic. These are popping up all over the country in pharmacies, drugstores, and shopping centers. They're usually staffed by nurse practitioners or physician assistants that have been trained to treat common illnesses and complaints. They're usually fairly quick and inexpensive. However, if  you have serious medical issues or chronic medical problems, these are probably not your best option.  No Primary Care Doctor: - Call Health Connect at  [phone] - they can help you locate a primary care doctor that  accepts your insurance, provides certain services, etc. - Physician Referral Service- [phone]  Chronic Pain Problems: Organization         Address  Phone   Notes  [facility]  [phone] Patients need to be referred by their primary care doctor.   Medication Assistance: Organization         Address  Phone   Notes  [facility]ication [facility] [address] [place], [place] [address]  [phone] --Must be a resident of [facility] [place] [facility] [place] -- Must have NO insurance coverage whatsoever (no Medicaid/ Medicare, etc.) -- The pt. MUST have a primary care doctor that directs their care regularly and follows them in the community   Med[facility]ist  [phone]) [phone]   [employer]  [phone]    Agencies that provide inexpensive medical care: Organization         Address  Phone   Notes  [facility]  [phone]   [facility]    [phone]   [facility] [address] [place], [place] [address] [phone]   [facility] [place] [address], [place] [phone]) [phone]   [facility]anned Parenthood    [phone](336) [phone]   [facility]    [phone]   [facility]  [address], [place] Phone:  [phone], Fax:  [phone] Hours of Operation:  9 am - 6 pm, M-F.  Also accepts Medicaid/Medicare and self-pay.  [facility]  [address], [place] Phone: [phone], Fax: [phone]) [phone]. Hours of Operation:  8:30 am - 5:30 pm, M-F.  Also accepts Medicaid and self-pay.  [facility] [place] [address], [place] Phone: [phone]) [phone]   [facility] [address], [place], [place] [phone], Ext. [phone] Mondays & Thursdays: 7-9 AM.  First 15 patients are seen on a first come, first serve basis.    Medicaid-accepting [facility] Providers:  Organization         Address  Phone   Notes  [facility] [address], [place] [phone] Also accepts self-pay patients.  [facility] [address], [place]  [phone]) [phone]   [facility] [address], [place] [phone]   [facility] [address] [place] [address], [place] [phone]   Renaye RakersVeita Bland [address], [place]   [phone])  [phone] Only accepts [place] Access [place] patients after they have their name applied to their card.   Self-Pay (no insurance) in [facility]:  Organization         Address  Phone   Notes  Sickle Cell Patients, [facility] [address], [place] [phone]) [phone]   [facility] [address], [place] [phone]) [phone]   [facility] [place]  [address], [place] [phone]) [phone]   [facility]/Dr. Osei-Bonsu  [address] [place] [address], [place] or [phone], [place] [phone] Phone number for both [facility] and  [place] locations is the same.  [facility] [address], [place] [phone]   [facility] [place] [address] [place] [address], [place] or [address] [phone]   [facility] [address], [place] [phone], phone; [phone](336) [phone], fax Sees patients 1st and 3rd Saturday of every month.  Must not qualify for public or private insurance (i.e. Medicaid, Medicare, [place] Health Choice, Veterans' Benefits)  Household income should be no more than 200% of the poverty level The clinic cannot treat you if you are pregnant or think you are pregnant  Sexually transmitted diseases are not treated at the clinic.    Dental Care: Organization         Address  Phone  Notes  [facility] [address], [place] [phone] Accepts children up to age 37 who are enrolled in [place]icaid or [place] Health Choice; pregnant women with a Medicaid card; and children who have applied for Medicaid or [place] Health Choice, but were declined, whose parents can pay a reduced fee at time of service.  [facility] [place]  [address], [place] [phone] Accepts children up to age 37 who are enrolled in [place]icaid or [place] Health  Choice; pregnant women with a Medicaid card; and children who have applied for Medicaid or [place] Health Choice, but were declined, whose parents can pay a reduced fee at time of service.  [facility]  [address], [place] [phone] Patients are seen by appointment only. Walk-ins are not accepted. [facility] will see patients 37 years of age and older. Monday - Tuesday (8am-5pm) Most Wednesdays (8:30-5pm) $30 per visit, cash only  [facility]  [address], [place] [facility] [place] [phone] Patients are seen by appointment only. Walk-ins are not accepted. [facility] will see patients 37 years of age and older. One Wednesday Evening (Monthly: Volunteer Based).  $30 per visit, cash only  [facility]  [phone] for adults; Children under age 744, call Graduate Pediatric Dentistry at [phone]. Children aged 794-14, please call [phone] to request a pediatric application.  Dental services are provided in all areas of dental care including fillings, crowns and bridges, complete and partial dentures, implants, gum treatment, root canals, and extractions. Preventive care is also provided. Treatment is provided to both adults and children. Patients are selected via a lottery and there is often a waiting list.   [facility] [address], [place]  [phone] [email or web address]   [facility] [address], [place], [place] [phone], Ext. [phone] Second and Fourth Thursday of each month, opens at 6:30 AM; Clinic ends at 9 AM.  Patients are seen on a first-come first-served basis, and a limited number are seen during each clinic.   [facility]  [address], [place], [place] [phone]) [phone]   Eligibility Requirements You must have lived in [place], [place], or [place] counties for at least the last three months.   You cannot be eligible for state or  federal sponsored National Cityhealthcare insurance, including CIGNAVeterans Administration, [place]icaid, or [employer]Medicare.   You generally cannot be eligible for healthcare insurance through your employer.    How to apply: Eligibility screenings are held every Tuesday and Wednesday afternoon from 1:00 pm until 4:00 pm. You do not  need an appointment for the interview!  [facility] [address] [place], [place] [phone]   [facility] Health Department  [phone]   [facility] Health Department  [phone]   [facility] Health Department  [phone]    Behavioral Health Resources in the Community: Intensive Outpatient Programs Organization         Address  Phone  Notes  [facility] [address] [place], [place] [phone]   [facility] Outpatient [address] [place], [place] [phone]   [facility] Drug [facility] [address], [place], [place]  [phone]   [facility] Mental Health [address],  [place], [place] [phone] or [phone]   Substance Abuse Resources Organization         Address  Phone  Notes  Alcohol and Drug Services  [phone]   [facility]  [phone]   [facility] [place]  [phone]   Floydene Flock  [phone]   Residential & Outpatient Substance Abuse Program  [phone]   Psychological Services Organization         Address  Phone  Notes  [facility] Behavioral Health  [phone]   [facility] Services  [phone]   [facility] Mental Health [address], [place] [phone] or [phone]    Mobile Crisis Teams Organization         Address  Phone  Notes  [facility], Mobile Crisis Care Unit  [phone]   [facility]  [address] [place], [place] [phone]   Doristine Locks [address] [place] [phone]    Self-Help/Support Groups Organization         Address  Phone              Notes  [facility] [place] - variety of support groups  [phone]- I7437963 Call for more information  Narcotics Anonymous (NA), [facility] [address], [employer] [place]  2 meetings at this location   Statistician         Address  Phone  Notes  [facility] [address],    [place]  [phone]   [facility]  [address], [place] [address], [place], [place] [phone]   [facility] [address] [place], [place] [phone] Admissions: 8am-3pm M-F  [facility] [address]    [place], [place] [phone]   [facility] [address] [place], [place], [place] [phone]   [facility] [address]  [place], [place] [phone]   [facility] - Intensive Outpatient [address], [place], [place] [phone]   [facility] ([facility] [address]  [place], [place] [phone] or [phone]   [facility] ([facility]) [address] [place], [place] [phone] Accepts Medicaid  [facility] [place] [address]  [place] [phone] Substance Abuse/Addiction Treatment   [facility] Organization         Address  Phone  Notes  [facility]  [phone]   Angie Fava, PhD [address] [place], [place]   [phone] or [phone]   [facility] Behavioral   [address] [place], [place] [phone]   [facility] [address], [place], [place] [phone] Insurance/Medicaid/sponsorship through [employer] and Families [address]  [place], [place] [phone] Therapy/tele-psych/case  [facility] [address]   [place], [place] [phone]    Dr. Lolly Mustache  [phone]   [facility] [place]  [employer] [facility]  Dept. 1) [address]  2) [address] [place] 3)  [address], [place] [phone]  [phone]   [facility] Child Abuse Hotline 718-461-9609 or 3128041404 (After Hours)

## 2015-04-13 NOTE — ED Notes (Signed)
Bed: ZO10 Expected date:  Expected time:  Means of arrival:  Comments: EMS etoh

## 2016-02-15 ENCOUNTER — Emergency Department (HOSPITAL_COMMUNITY)
Admission: EM | Admit: 2016-02-15 | Discharge: 2016-02-16 | Disposition: A | Discharge status: Home / Self Care | Payer: Self-pay | Attending: Emergency Medicine | Admitting: Emergency Medicine

## 2016-02-15 ENCOUNTER — Encounter (HOSPITAL_COMMUNITY): Payer: Self-pay | Admitting: Emergency Medicine

## 2016-02-15 DIAGNOSIS — Z79899 Other long term (current) drug therapy: Secondary | ICD-10-CM | POA: Insufficient documentation

## 2016-02-15 DIAGNOSIS — Z87891 Personal history of nicotine dependence: Secondary | ICD-10-CM | POA: Insufficient documentation

## 2016-02-15 DIAGNOSIS — F1010 Alcohol abuse, uncomplicated: Secondary | ICD-10-CM

## 2016-02-15 DIAGNOSIS — F10129 Alcohol abuse with intoxication, unspecified: Secondary | ICD-10-CM | POA: Insufficient documentation

## 2016-02-15 LAB — COMPREHENSIVE METABOLIC PANEL
ALK PHOS: 42 U/L (ref 38–126)
ALT: 17 U/L (ref 14–54)
AST: 30 U/L (ref 15–41)
Albumin: 4.4 g/dL (ref 3.5–5.0)
Anion gap: 11 (ref 5–15)
BILIRUBIN TOTAL: 0.6 mg/dL (ref 0.3–1.2)
BUN: 12 mg/dL (ref 6–20)
CALCIUM: 8.5 mg/dL — AB (ref 8.9–10.3)
CO2: 20 mmol/L — ABNORMAL LOW (ref 22–32)
CREATININE: 0.8 mg/dL (ref 0.44–1.00)
Chloride: 108 mmol/L (ref 101–111)
Glucose, Bld: 95 mg/dL (ref 65–99)
Potassium: 4 mmol/L (ref 3.5–5.1)
Sodium: 139 mmol/L (ref 135–145)
Total Protein: 7.7 g/dL (ref 6.5–8.1)

## 2016-02-15 LAB — CBC WITH DIFFERENTIAL/PLATELET
Basophils Absolute: 0 10*3/uL (ref 0.0–0.1)
Basophils Relative: 0 %
EOS PCT: 3 %
Eosinophils Absolute: 0.2 10*3/uL (ref 0.0–0.7)
HEMATOCRIT: 35.9 % — AB (ref 36.0–46.0)
HEMOGLOBIN: 12.1 g/dL (ref 12.0–15.0)
LYMPHS ABS: 2 10*3/uL (ref 0.7–4.0)
LYMPHS PCT: 33 %
MCH: 31.7 pg (ref 26.0–34.0)
MCHC: 33.7 g/dL (ref 30.0–36.0)
MCV: 94 fL (ref 78.0–100.0)
Monocytes Absolute: 0.3 10*3/uL (ref 0.1–1.0)
Monocytes Relative: 5 %
NEUTROS PCT: 59 %
Neutro Abs: 3.6 10*3/uL (ref 1.7–7.7)
Platelets: 246 10*3/uL (ref 150–400)
RBC: 3.82 MIL/uL — ABNORMAL LOW (ref 3.87–5.11)
RDW: 13.7 % (ref 11.5–15.5)
WBC: 6.1 10*3/uL (ref 4.0–10.5)

## 2016-02-15 LAB — I-STAT CHEM 8, ED
BUN: 15 mg/dL (ref 6–20)
CALCIUM ION: 1.08 mmol/L — AB (ref 1.15–1.40)
CHLORIDE: 108 mmol/L (ref 101–111)
Creatinine, Ser: 1.2 mg/dL — ABNORMAL HIGH (ref 0.44–1.00)
Glucose, Bld: 92 mg/dL (ref 65–99)
HCT: 37 % (ref 36.0–46.0)
Hemoglobin: 12.6 g/dL (ref 12.0–15.0)
Potassium: 4.2 mmol/L (ref 3.5–5.1)
SODIUM: 140 mmol/L (ref 135–145)
TCO2: 22 mmol/L (ref 0–100)

## 2016-02-15 LAB — I-STAT BETA HCG BLOOD, ED (MC, WL, AP ONLY)

## 2016-02-15 LAB — ETHANOL: Alcohol, Ethyl (B): 408 mg/dL (ref <5)

## 2016-02-15 LAB — CBG MONITORING, ED: Glucose-Capillary: 77 mg/dL (ref 65–99)

## 2016-02-15 MED ORDER — SODIUM CHLORIDE 0.9 % IV BOLUS (SEPSIS)
1000.0000 mL | Freq: Once | INTRAVENOUS | Status: AC
  Administered 2016-02-15: 1000 mL via INTRAVENOUS

## 2016-02-15 NOTE — ED Provider Notes (Signed)
[facility]-EMERGENCY DEPT Provider Note   CSN: [id number] Arrival date & time: 02/15/16  2017     History   Chief Complaint Chief Complaint  Patient presents with  . Altered Mental Status    HPI Caitlin Brown is a 35 y.o. female.  HPI Level V caveat applies. Patient brought in by EMS for altered mental status when noted by bartender to be slumped over at a table. Per EMS patient had been drinking since 2 PM. No known trauma or other coingestants. No past medical history on file.  There are no active problems to display for this patient.   Past Surgical History:  Procedure Laterality Date  . CESAREAN SECTION      OB History    No data available       Home Medications    Prior to Admission medications   Medication Sig Start Date End Date Taking? Authorizing Provider  sertraline (ZOLOFT) 50 MG tablet Take 75 mg by mouth daily. 01/24/16  Yes Historical Provider, MD    Family History No family history on file.  Social History Social History  Substance Use Topics  . Smoking status: Former Games developermoker  . Smokeless tobacco: Not on file  . Alcohol use No     Allergies   Patient has no known allergies.   Review of Systems Review of Systems  Unable to perform ROS: Mental status change     Physical Exam Updated Vital Signs BP 104/65 (BP Location: Left Arm)   Pulse 80   Temp 98.2 F (36.8 C) (Oral)   Resp 16   SpO2 94%   Physical Exam  Constitutional: She appears well-developed and well-nourished. No distress.  HENT:  Head: Normocephalic and atraumatic.  Mouth/Throat: Oropharynx is clear and moist.  No evidence of any trauma.  Eyes: EOM are normal. Pupils are equal, round, and reactive to light.  Pupils 3 mm bilaterally and sluggish.  Neck: Normal range of motion. Neck supple.  Cardiovascular: Normal rate and regular rhythm.  Exam reveals no gallop and no friction rub.   No murmur heard. Pulmonary/Chest: Effort normal and breath sounds normal. No  respiratory distress. She has no wheezes. She has no rales. She exhibits no tenderness.  Abdominal: Soft. Bowel sounds are normal. There is no tenderness. There is no rebound and no guarding.  Musculoskeletal: Normal range of motion. She exhibits no edema or tenderness.  Neurological:  Patient's lethargic but arousable to noxious stimuli.  Skin: Skin is warm and dry. Capillary refill takes less than 2 seconds. No rash noted. No erythema.  Nursing note and vitals reviewed.    ED Treatments / Results  Labs (all labs ordered are listed, but only abnormal results are displayed) Labs Reviewed  ETHANOL - Abnormal; Notable for the following:       Result Value   Alcohol, Ethyl (B) 408 (*)    All other components within normal limits  CBC WITH DIFFERENTIAL/PLATELET - Abnormal; Notable for the following:    RBC 3.82 (*)    HCT 35.9 (*)    All other components within normal limits  COMPREHENSIVE METABOLIC PANEL - Abnormal; Notable for the following:    CO2 20 (*)    Calcium 8.5 (*)    All other components within normal limits  I-STAT CHEM 8, ED - Abnormal; Notable for the following:    Creatinine, Ser 1.20 (*)    Calcium, Ion 1.08 (*)    All other components within normal limits  RAPID URINE DRUG SCREEN,  HOSP PERFORMED  CBG MONITORING, ED  I-STAT BETA HCG BLOOD, ED ([facility], [facility], [facility] ONLY)    EKG  EKG Interpretation None       Radiology No results found.  Procedures Procedures (including critical care time)  Medications Ordered in ED Medications  sodium chloride 0.9 % bolus 1,000 mL (0 mLs Intravenous Stopped 02/15/16 2220)     Initial Impression / Assessment and Plan / ED Course  I have reviewed the triage vital signs and the nursing notes.  Pertinent labs & imaging results that were available during my care of the patient were reviewed by me and considered in my medical decision making (see chart for details).  Clinical Course    Patient was seen in the emergency  department for acute alcohol intoxication earlier this year. Do not see any evidence of trauma. Check basic labs, alcohol level and UDS. Will continue to observe closely for return to sobriety. Patient is now more alert. Ambulating without difficulty. Denies any other coingestants. Advised that she can be discharged if she has a sober ride home. Patient says she will call friend to pick her up. Final Clinical Impressions(s) / ED Diagnoses   Final diagnoses:  ETOH abuse    New Prescriptions New Prescriptions   No medications on file     Loren Raceravid Airabella Barley, MD 02/16/16 [phone]

## 2016-02-15 NOTE — ED Notes (Signed)
Patient urinated but failed to urinate in the specimen cup
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
## 2016-02-16 LAB — RAPID URINE DRUG SCREEN, HOSP PERFORMED
Amphetamines: NOT DETECTED
BARBITURATES: NOT DETECTED
Benzodiazepines: NOT DETECTED
Cocaine: NOT DETECTED
Opiates: NOT DETECTED
Tetrahydrocannabinol: NOT DETECTED

## 2016-02-16 NOTE — ED Notes (Signed)
Per Caitlin JacobsonHelen, NT pt left room prior to receiving d/c instructions, VS, pain assessment and e-sign.

## 2016-05-13 ENCOUNTER — Emergency Department (HOSPITAL_COMMUNITY)
Admission: EM | Admit: 2016-05-13 | Discharge: 2016-05-14 | Disposition: A | Discharge status: Home / Self Care | Payer: 59 | Attending: Emergency Medicine | Admitting: Emergency Medicine

## 2016-05-13 ENCOUNTER — Encounter (HOSPITAL_COMMUNITY): Payer: Self-pay | Admitting: Family Medicine

## 2016-05-13 DIAGNOSIS — F10120 Alcohol abuse with intoxication, uncomplicated: Secondary | ICD-10-CM | POA: Diagnosis not present

## 2016-05-13 DIAGNOSIS — F10920 Alcohol use, unspecified with intoxication, uncomplicated: Secondary | ICD-10-CM

## 2016-05-13 DIAGNOSIS — Z87891 Personal history of nicotine dependence: Secondary | ICD-10-CM | POA: Insufficient documentation

## 2016-05-13 MED ORDER — ONDANSETRON 8 MG PO TBDP
8.0000 mg | ORAL_TABLET | Freq: Once | ORAL | Status: AC
  Administered 2016-05-13: 8 mg via ORAL
  Filled 2016-05-13: qty 1

## 2016-05-13 NOTE — ED Provider Notes (Signed)
[facility] DEPT Provider Note   CSN: [id number] Arrival date & time: 05/13/16  2251  By signing my name below, I, Elder Negus, attest that this documentation has been prepared under the direction and in the presence of Iram Lundberg, MD. Electronically Signed: Elder Negus, Scribe. 05/13/16. 11:38 PM.   History   Chief Complaint Chief Complaint  Patient presents with  . Alcohol Intoxication  . "Not Feeling Well"   LEVEL 5 CAVEAT DUE TO ALCOHOL INTOXICATION  HPI Caitlin Brown is a 36 y.o. female without any chronic medical problems who presents to the ED with alcohol intoxication. According to medic report, this patient primarily complained "of not feeling well". Reported heavy alcohol consumption tonight. When entering the room for interview, the patient is actively vomiting and unable to participate in medical interview. The patient has previous visits with similar presentations.   A complete history is limited.   The history is provided by the EMS personnel. No language interpreter was used.  Alcohol Intoxication  This is a recurrent problem. The problem occurs constantly. The problem has not changed since onset.Nothing aggravates the symptoms. Nothing relieves the symptoms. She has tried nothing for the symptoms. The treatment provided no relief.    History reviewed. No pertinent past medical history.  There are no active problems to display for this patient.   Past Surgical History:  Procedure Laterality Date  . CESAREAN SECTION      OB History    No data available       Home Medications    Prior to Admission medications   Medication Sig Start Date End Date Taking? Authorizing Provider  sertraline (ZOLOFT) 50 MG tablet Take 75 mg by mouth daily. 01/24/16   Historical Provider, MD    Family History History reviewed. No pertinent family history.  Social History Social History  Substance Use Topics  . Smoking status: Former Games developer  . Smokeless  tobacco: Never Used  . Alcohol use Yes     Comment: Daily. Last drink: today     Allergies   Patient has no known allergies.   Review of Systems Review of Systems  Unable to perform ROS: Other (DUE TO ALCOHOL INTOXICATION)     Physical Exam Updated Vital Signs BP 143/92 (BP Location: Left Arm)   Pulse 115   Temp 98.1 F (36.7 C) (Oral)   Resp 20   Ht 5\' 6"  (1.676 m)   Wt 150 lb (68 kg)   SpO2 95%   BMI 24.21 kg/m   Physical Exam  Constitutional: She appears well-developed and well-nourished. No distress.  HENT:  Head: Normocephalic and atraumatic.  Nose: Nose normal.  Mouth/Throat: No oropharyngeal exudate.  Eyes: Conjunctivae are normal. Pupils are equal, round, and reactive to light.  Neck: Normal range of motion. Neck supple. No JVD present.  Cardiovascular: Normal rate, regular rhythm, normal heart sounds and intact distal pulses.   Pulmonary/Chest: Effort normal and breath sounds normal. No stridor. No respiratory distress. She has no wheezes. She has no rales.  Abdominal: Soft. Bowel sounds are normal. She exhibits no mass. There is no tenderness. There is no rebound and no guarding.  Musculoskeletal: Normal range of motion. She exhibits no edema.  Lymphadenopathy:    She has no cervical adenopathy.  Neurological: She is alert. She displays normal reflexes.  Skin: Skin is warm and dry. Capillary refill takes less than 2 seconds. She is not diaphoretic.  Psychiatric:  unable     ED Treatments / Results  Vitals:   05/13/16 2256  BP: 143/92  Pulse: 115  Resp: 20  Temp: 98.1 F (36.7 C)     DIAGNOSTIC STUDIES: Oxygen Saturation is 95 percent on room air which is normal by my interpretation.    COORDINATION OF CARE: 11:32 PM First encounter.   Procedures Procedures (including critical care time)  Medications Ordered in ED  Medications  ondansetron (ZOFRAN-ODT) disintegrating tablet 8 mg (not administered)     Final Clinical  Impressions(s) / ED Diagnoses  Alcohol intoxication:  Sobered up and PO challenged in the ED. Ambulated to the bathroom but missed the hat in the toilet to obtain urine specimen.  No more alcohol.  Patient is well appearing, normal vital signs.   Based on history and exam patient has been appropriately medically screened and emergency conditions excluded. Patient is stable for discharge at this time. Strict return precautions given for persistent vomiting, diarrhea, abdominal pain shortness of breath, chest pain, worsening symptomsor anyfurther problems or concerns.  Follow up with your PMD in 2 days for recheck   I personally performed the services described in this documentation, which was scribed in my presence. The recorded information has been reviewed and is accurate.        Cy BlamerApril Bryony Kaman, MD 05/14/16 [phone]

## 2016-05-13 NOTE — ED Triage Notes (Signed)
Pt comes from [address] via EMS complaining of "not feeling good".  Pt endorses ETOH and states she doesn't know how much.  Pt emotional on EMS arrival. Vitals WNL.

## 2016-05-13 NOTE — ED Notes (Signed)
Pt is alert and oriented and is sitting on the floor emotional and vomiting.

## 2016-05-14 ENCOUNTER — Encounter (HOSPITAL_COMMUNITY): Payer: Self-pay | Admitting: Emergency Medicine

## 2016-05-14 LAB — I-STAT CHEM 8, ED
BUN: 8 mg/dL (ref 6–20)
CHLORIDE: 104 mmol/L (ref 101–111)
Calcium, Ion: 0.99 mmol/L — ABNORMAL LOW (ref 1.15–1.40)
Creatinine, Ser: 1.1 mg/dL — ABNORMAL HIGH (ref 0.44–1.00)
GLUCOSE: 89 mg/dL (ref 65–99)
HEMATOCRIT: 41 % (ref 36.0–46.0)
HEMOGLOBIN: 13.9 g/dL (ref 12.0–15.0)
POTASSIUM: 3.8 mmol/L (ref 3.5–5.1)
SODIUM: 142 mmol/L (ref 135–145)
TCO2: 23 mmol/L (ref 0–100)

## 2016-05-14 LAB — I-STAT BETA HCG BLOOD, ED (MC, WL, AP ONLY)

## 2016-05-14 MED ORDER — ACETAMINOPHEN 500 MG PO TABS
1000.0000 mg | ORAL_TABLET | Freq: Once | ORAL | Status: AC
  Administered 2016-05-14: 1000 mg via ORAL
  Filled 2016-05-14: qty 2

## 2016-05-14 MED ORDER — ONDANSETRON 8 MG PO TBDP: ORAL_TABLET | ORAL | 0 refills | Status: DC

## 2016-05-14 NOTE — ED Notes (Signed)
Pt made aware urine sample is needed.  

## 2016-05-28 ENCOUNTER — Emergency Department (HOSPITAL_COMMUNITY)
Admission: EM | Admit: 2016-05-28 | Discharge: 2016-05-28 | Disposition: A | Discharge status: Home / Self Care | Payer: No Typology Code available for payment source | Attending: Emergency Medicine | Admitting: Emergency Medicine

## 2016-05-28 ENCOUNTER — Encounter (HOSPITAL_COMMUNITY): Payer: Self-pay

## 2016-05-28 ENCOUNTER — Emergency Department (HOSPITAL_COMMUNITY): Payer: No Typology Code available for payment source

## 2016-05-28 DIAGNOSIS — Z23 Encounter for immunization: Secondary | ICD-10-CM | POA: Diagnosis not present

## 2016-05-28 DIAGNOSIS — Z87891 Personal history of nicotine dependence: Secondary | ICD-10-CM | POA: Insufficient documentation

## 2016-05-28 DIAGNOSIS — R51 Headache: Secondary | ICD-10-CM | POA: Diagnosis not present

## 2016-05-28 DIAGNOSIS — Y999 Unspecified external cause status: Secondary | ICD-10-CM | POA: Insufficient documentation

## 2016-05-28 DIAGNOSIS — Y939 Activity, unspecified: Secondary | ICD-10-CM | POA: Insufficient documentation

## 2016-05-28 DIAGNOSIS — S0033XA Contusion of nose, initial encounter: Secondary | ICD-10-CM | POA: Insufficient documentation

## 2016-05-28 DIAGNOSIS — Y92410 Unspecified street and highway as the place of occurrence of the external cause: Secondary | ICD-10-CM | POA: Insufficient documentation

## 2016-05-28 DIAGNOSIS — S00511A Abrasion of lip, initial encounter: Secondary | ICD-10-CM | POA: Diagnosis not present

## 2016-05-28 DIAGNOSIS — S0992XA Unspecified injury of nose, initial encounter: Secondary | ICD-10-CM | POA: Diagnosis present

## 2016-05-28 HISTORY — DX: Depression, unspecified: F32.A

## 2016-05-28 HISTORY — DX: Major depressive disorder, single episode, unspecified: F32.9

## 2016-05-28 MED ORDER — HYDROCODONE-ACETAMINOPHEN 5-325 MG PO TABS
1.0000 | ORAL_TABLET | Freq: Once | ORAL | Status: AC
  Administered 2016-05-28: 1 via ORAL
  Filled 2016-05-28: qty 1

## 2016-05-28 MED ORDER — TETANUS-DIPHTH-ACELL PERTUSSIS 5-2.5-18.5 LF-MCG/0.5 IM SUSP
0.5000 mL | Freq: Once | INTRAMUSCULAR | Status: AC
  Administered 2016-05-28: 0.5 mL via INTRAMUSCULAR
  Filled 2016-05-28: qty 0.5
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
## 2016-05-28 NOTE — ED Notes (Signed)
Patient transported to CT 

## 2016-05-29 ENCOUNTER — Encounter (HOSPITAL_COMMUNITY): Payer: Self-pay | Admitting: Adult Health

## 2016-05-29 ENCOUNTER — Observation Stay (HOSPITAL_COMMUNITY)
Admission: EM | Admit: 2016-05-29 | Discharge: 2016-05-31 | DRG: 897 | Disposition: A | Discharge status: Home / Self Care | Payer: 59 | Attending: Nephrology | Admitting: Nephrology

## 2016-05-29 DIAGNOSIS — F10230 Alcohol dependence with withdrawal, uncomplicated: Secondary | ICD-10-CM

## 2016-05-29 DIAGNOSIS — Z87891 Personal history of nicotine dependence: Secondary | ICD-10-CM | POA: Diagnosis not present

## 2016-05-29 DIAGNOSIS — Y900 Blood alcohol level of less than 20 mg/100 ml: Secondary | ICD-10-CM | POA: Diagnosis present

## 2016-05-29 DIAGNOSIS — F329 Major depressive disorder, single episode, unspecified: Secondary | ICD-10-CM | POA: Diagnosis not present

## 2016-05-29 DIAGNOSIS — Z811 Family history of alcohol abuse and dependence: Secondary | ICD-10-CM

## 2016-05-29 DIAGNOSIS — S0083XS Contusion of other part of head, sequela: Secondary | ICD-10-CM | POA: Diagnosis not present

## 2016-05-29 DIAGNOSIS — E8729 Other acidosis: Secondary | ICD-10-CM | POA: Diagnosis present

## 2016-05-29 DIAGNOSIS — E872 Acidosis: Secondary | ICD-10-CM | POA: Diagnosis not present

## 2016-05-29 DIAGNOSIS — F10939 Alcohol use, unspecified with withdrawal, unspecified: Secondary | ICD-10-CM | POA: Diagnosis present

## 2016-05-29 DIAGNOSIS — R740 Nonspecific elevation of levels of transaminase and lactic acid dehydrogenase [LDH]: Secondary | ICD-10-CM

## 2016-05-29 DIAGNOSIS — E876 Hypokalemia: Secondary | ICD-10-CM | POA: Diagnosis not present

## 2016-05-29 DIAGNOSIS — Z79899 Other long term (current) drug therapy: Secondary | ICD-10-CM

## 2016-05-29 DIAGNOSIS — E860 Dehydration: Secondary | ICD-10-CM | POA: Diagnosis present

## 2016-05-29 DIAGNOSIS — F10239 Alcohol dependence with withdrawal, unspecified: Principal | ICD-10-CM | POA: Diagnosis present

## 2016-05-29 DIAGNOSIS — D72829 Elevated white blood cell count, unspecified: Secondary | ICD-10-CM | POA: Diagnosis present

## 2016-05-29 DIAGNOSIS — R7401 Elevation of levels of liver transaminase levels: Secondary | ICD-10-CM | POA: Diagnosis present

## 2016-05-29 LAB — RAPID URINE DRUG SCREEN, HOSP PERFORMED
AMPHETAMINES: NOT DETECTED
Barbiturates: NOT DETECTED
Benzodiazepines: POSITIVE — AB
COCAINE: NOT DETECTED
OPIATES: NOT DETECTED
TETRAHYDROCANNABINOL: NOT DETECTED

## 2016-05-29 LAB — COMPREHENSIVE METABOLIC PANEL
ALT: 28 U/L (ref 14–54)
AST: 50 U/L — AB (ref 15–41)
Albumin: 4.8 g/dL (ref 3.5–5.0)
Alkaline Phosphatase: 65 U/L (ref 38–126)
Anion gap: 17 — ABNORMAL HIGH (ref 5–15)
BILIRUBIN TOTAL: 1 mg/dL (ref 0.3–1.2)
BUN: 7 mg/dL (ref 6–20)
CO2: 22 mmol/L (ref 22–32)
CREATININE: 0.91 mg/dL (ref 0.44–1.00)
Calcium: 8.8 mg/dL — ABNORMAL LOW (ref 8.9–10.3)
Chloride: 102 mmol/L (ref 101–111)
GFR calc Af Amer: >60 mL/min (ref >60)
GFR calc non Af Amer: >60 mL/min (ref >60)
Glucose, Bld: 83 mg/dL (ref 65–99)
POTASSIUM: 4 mmol/L (ref 3.5–5.1)
Sodium: 141 mmol/L (ref 135–145)
TOTAL PROTEIN: 7.9 g/dL (ref 6.5–8.1)

## 2016-05-29 LAB — ETHANOL: ALCOHOL ETHYL (B): 18 mg/dL — AB (ref <5)

## 2016-05-29 LAB — CBC
HCT: 36.7 % (ref 36.0–46.0)
HEMOGLOBIN: 12.2 g/dL (ref 12.0–15.0)
MCH: 30.8 pg (ref 26.0–34.0)
MCHC: 33.2 g/dL (ref 30.0–36.0)
MCV: 92.7 fL (ref 78.0–100.0)
Platelets: 245 10*3/uL (ref 150–400)
RBC: 3.96 MIL/uL (ref 3.87–5.11)
RDW: 13.4 % (ref 11.5–15.5)
WBC: 15 10*3/uL — AB (ref 4.0–10.5)

## 2016-05-29 LAB — POC URINE PREG, ED: Preg Test, Ur: NEGATIVE

## 2016-05-29 MED ORDER — ACETAMINOPHEN 650 MG RE SUPP: 650.0000 mg | Freq: Four times a day (QID) | RECTAL | Status: DC | PRN

## 2016-05-29 MED ORDER — ENOXAPARIN SODIUM 40 MG/0.4ML ~~LOC~~ SOLN
40.0000 mg | SUBCUTANEOUS | Status: DC
  Administered 2016-05-29 – 2016-05-30 (×2): 40 mg via SUBCUTANEOUS
  Filled 2016-05-29 (×2): qty 0.4

## 2016-05-29 MED ORDER — VITAMIN B-1 100 MG PO TABS
100.0000 mg | ORAL_TABLET | Freq: Every day | ORAL | Status: DC
  Administered 2016-05-30 – 2016-05-31 (×2): 100 mg via ORAL
  Filled 2016-05-29 (×2): qty 1

## 2016-05-29 MED ORDER — SERTRALINE HCL 25 MG PO TABS
75.0000 mg | ORAL_TABLET | Freq: Every day | ORAL | Status: DC
  Administered 2016-05-30 – 2016-05-31 (×2): 75 mg via ORAL
  Filled 2016-05-29 (×3): qty 1

## 2016-05-29 MED ORDER — ADULT MULTIVITAMIN W/MINERALS CH
1.0000 | ORAL_TABLET | Freq: Every day | ORAL | Status: DC
  Administered 2016-05-29 – 2016-05-31 (×3): 1 via ORAL
  Filled 2016-05-29 (×3): qty 1

## 2016-05-29 MED ORDER — ONDANSETRON HCL 4 MG/2ML IJ SOLN: 4.0000 mg | Freq: Four times a day (QID) | INTRAMUSCULAR | Status: DC | PRN

## 2016-05-29 MED ORDER — SODIUM CHLORIDE 0.9% FLUSH
3.0000 mL | Freq: Two times a day (BID) | INTRAVENOUS | Status: DC
  Administered 2016-05-30: 3 mL via INTRAVENOUS

## 2016-05-29 MED ORDER — LORAZEPAM 1 MG PO TABS
1.0000 mg | ORAL_TABLET | Freq: Once | ORAL | Status: AC
  Administered 2016-05-29: 1 mg via ORAL
  Filled 2016-05-29: qty 1

## 2016-05-29 MED ORDER — CHLORDIAZEPOXIDE HCL 25 MG PO CAPS
25.0000 mg | ORAL_CAPSULE | Freq: Three times a day (TID) | ORAL | Status: DC
  Administered 2016-05-29 – 2016-05-30 (×2): 25 mg via ORAL
  Filled 2016-05-29 (×2): qty 1

## 2016-05-29 MED ORDER — ONDANSETRON 4 MG PO TBDP
4.0000 mg | ORAL_TABLET | Freq: Four times a day (QID) | ORAL | Status: DC | PRN
  Filled 2016-05-29: qty 1

## 2016-05-29 MED ORDER — ONDANSETRON HCL 4 MG PO TABS: 4.0000 mg | ORAL_TABLET | Freq: Four times a day (QID) | ORAL | Status: DC | PRN

## 2016-05-29 MED ORDER — ACETAMINOPHEN 325 MG PO TABS
650.0000 mg | ORAL_TABLET | Freq: Four times a day (QID) | ORAL | Status: DC | PRN
  Administered 2016-05-29 – 2016-05-31 (×6): 650 mg via ORAL
  Filled 2016-05-29 (×7): qty 2

## 2016-05-29 MED ORDER — CHLORDIAZEPOXIDE HCL 25 MG PO CAPS
25.0000 mg | ORAL_CAPSULE | Freq: Four times a day (QID) | ORAL | Status: DC | PRN
  Administered 2016-05-29: 25 mg via ORAL
  Filled 2016-05-29: qty 1

## 2016-05-29 MED ORDER — SODIUM CHLORIDE 0.9 % IV SOLN
INTRAVENOUS | Status: DC
  Administered 2016-05-29 – 2016-05-30 (×2): via INTRAVENOUS

## 2016-05-29 MED ORDER — ONDANSETRON 4 MG PO TBDP
4.0000 mg | ORAL_TABLET | Freq: Once | ORAL | Status: AC | PRN
  Administered 2016-05-29: 4 mg via ORAL

## 2016-05-29 MED ORDER — ONDANSETRON 4 MG PO TBDP
ORAL_TABLET | ORAL | Status: AC
Start: 2016-05-29 — End: 2016-05-30
  Filled 2016-05-29: qty 1

## 2016-05-29 MED ORDER — LOPERAMIDE HCL 2 MG PO CAPS: 2.0000 mg | ORAL_CAPSULE | ORAL | Status: DC | PRN

## 2016-05-29 MED ORDER — HYDROXYZINE HCL 25 MG PO TABS: 25.0000 mg | ORAL_TABLET | Freq: Four times a day (QID) | ORAL | Status: DC | PRN

## 2016-05-29 MED ORDER — THIAMINE HCL 100 MG/ML IJ SOLN
100.0000 mg | Freq: Once | INTRAMUSCULAR | Status: AC
  Administered 2016-05-29: 100 mg via INTRAMUSCULAR
  Filled 2016-05-29: qty 2

## 2016-05-29 NOTE — ED Notes (Signed)
Attempted report x1. RN on floor to call me back. 

## 2016-05-29 NOTE — ED Notes (Signed)
Last drink was last night bottle and half of red wine. [facility] to [facility] and they told her to come here.
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
## 2016-05-29 NOTE — ED Notes (Signed)
PT did not have to use RR at this time  
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
## 2016-05-29 NOTE — Progress Notes (Signed)
Received report from [place], Charity fundraiser in Ed
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
## 2016-05-29 NOTE — ED Notes (Signed)
Pt said she is unable to give a UA. She feel dehydrated.

## 2016-05-30 DIAGNOSIS — F10239 Alcohol dependence with withdrawal, unspecified: Secondary | ICD-10-CM | POA: Diagnosis not present

## 2016-05-30 DIAGNOSIS — F10230 Alcohol dependence with withdrawal, uncomplicated: Secondary | ICD-10-CM | POA: Diagnosis not present

## 2016-05-30 DIAGNOSIS — D72829 Elevated white blood cell count, unspecified: Secondary | ICD-10-CM | POA: Diagnosis not present

## 2016-05-30 DIAGNOSIS — R740 Nonspecific elevation of levels of transaminase and lactic acid dehydrogenase [LDH]: Secondary | ICD-10-CM | POA: Diagnosis not present

## 2016-05-30 LAB — CBC
HEMATOCRIT: 31.2 % — AB (ref 36.0–46.0)
Hemoglobin: 10.4 g/dL — ABNORMAL LOW (ref 12.0–15.0)
MCH: 31.4 pg (ref 26.0–34.0)
MCHC: 33.3 g/dL (ref 30.0–36.0)
MCV: 94.3 fL (ref 78.0–100.0)
Platelets: 178 10*3/uL (ref 150–400)
RBC: 3.31 MIL/uL — ABNORMAL LOW (ref 3.87–5.11)
RDW: 13.4 % (ref 11.5–15.5)
WBC: 6.3 10*3/uL (ref 4.0–10.5)

## 2016-05-30 LAB — COMPREHENSIVE METABOLIC PANEL
ALBUMIN: 3.4 g/dL — AB (ref 3.5–5.0)
ALT: 21 U/L (ref 14–54)
AST: 32 U/L (ref 15–41)
Alkaline Phosphatase: 51 U/L (ref 38–126)
Anion gap: 11 (ref 5–15)
BUN: 7 mg/dL (ref 6–20)
CHLORIDE: 99 mmol/L — AB (ref 101–111)
CO2: 27 mmol/L (ref 22–32)
Calcium: 8.4 mg/dL — ABNORMAL LOW (ref 8.9–10.3)
Creatinine, Ser: 0.87 mg/dL (ref 0.44–1.00)
GFR calc Af Amer: >60 mL/min (ref >60)
GFR calc non Af Amer: >60 mL/min (ref >60)
GLUCOSE: 80 mg/dL (ref 65–99)
POTASSIUM: 3.4 mmol/L — AB (ref 3.5–5.1)
Sodium: 137 mmol/L (ref 135–145)
Total Bilirubin: 0.9 mg/dL (ref 0.3–1.2)
Total Protein: 6.4 g/dL — ABNORMAL LOW (ref 6.5–8.1)

## 2016-05-30 LAB — HIV ANTIBODY (ROUTINE TESTING W REFLEX): HIV SCREEN 4TH GENERATION: NONREACTIVE

## 2016-05-30 MED ORDER — POTASSIUM CHLORIDE CRYS ER 20 MEQ PO TBCR
40.0000 meq | EXTENDED_RELEASE_TABLET | Freq: Once | ORAL | Status: AC
  Administered 2016-05-30: 40 meq via ORAL
  Filled 2016-05-30: qty 2

## 2016-05-30 MED ORDER — FOLIC ACID 1 MG PO TABS
1.0000 mg | ORAL_TABLET | Freq: Every day | ORAL | Status: DC
  Administered 2016-05-30 – 2016-05-31 (×2): 1 mg via ORAL
  Filled 2016-05-30 (×2): qty 1

## 2016-05-30 MED ORDER — POTASSIUM CHLORIDE IN NACL 20-0.9 MEQ/L-% IV SOLN
INTRAVENOUS | Status: DC
  Administered 2016-05-30: 13:00:00 via INTRAVENOUS
  Filled 2016-05-30 (×2): qty 1000

## 2016-05-30 MED ORDER — LORAZEPAM 1 MG PO TABS
2.0000 mg | ORAL_TABLET | ORAL | Status: DC | PRN
  Administered 2016-05-30 (×2): 2 mg via ORAL
  Filled 2016-05-30 (×2): qty 2

## 2016-05-30 NOTE — Progress Notes (Addendum)
PROGRESS NOTE    Caitlin Brown  ZOX:[id number] DOB: May 15, 1980 DOA: 05/29/2016 PCP: Allean Found, MD   Brief Narrative: 36 y.o. female with medical history significant for alcoholism who presents to the ER requesting detoxification. At time of presentation she was exhibiting signs of acute withdrawal with tremulousness, hypertension and signs of dehydration. Patient initially sought help at [facility] for alcohol detox but given the severity of her symptoms she was referred to the ER for treatment.  Assessment & Plan:  # Alcohol withdrawal [facility]): Patient still has agitation and upper extremities tremor consistent with acute withdrawal. Education provided to the patient. Continue thiamine, folic acid, IV fluid. Change IV fluid with potassium. Discontinue Librium and chest 2 Ativan as needed. We'll continue to treat and observe today. The patient clinically improved she will be discharged tomorrow with outpatient follow-up for rehabilitation. Patient is motivated to quit at this time. Advance diet to heart healthy. -SW consulted on admission, needs SW assistance for detox program and outpatient resources.  # Transaminitis: Improved. #  Increased anion gap metabolic acidosis: Improved, continue IV fluid #  Leukocytosis: Improved #Hypokalemia: Added potassium and IV fluid. Ordered a dose of oral potassium as well.  DVT prophylaxis: Lovenox subcutaneous Code Status: Full code Family Communication: No family present at bedside Disposition Plan: Likely discharge home tomorrow if patient clinically improves.    Consultants:   None  Procedures: None Antimicrobials: None  Subjective: Patient was seen and examined at bedside. Reported nausea, mild agitation and has upper extremities tremor. Denied headache, vomiting, chest pain or shortness of breath.  Objective: Vitals:   05/29/16 1630 05/29/16 1805 05/30/16 0001 05/30/16 0502  BP: (!) 132/96 131/76 132/69 (!) 136/93    Pulse: 98 94 69 67  Resp: Temp:  98.8 F (37.1 C) 98.4 F (36.9 C) 97.9 F (36.6 C)  TempSrc:  Oral Oral Oral  SpO2: 98% 99% 100% 100%  Weight:  68.2 kg (150 lb 5.7 oz)    Height:   (1.676 m)     No intake or output data in the 24 hours ending 05/30/16 1146 Filed Weights   05/29/16 1200 05/29/16 1805  Weight: 68 kg (150 lb) 68.2 kg (150 lb 5.7 oz)    Examination:  General exam: Appears calm and comfortable  Respiratory system: Clear to auscultation. Respiratory effort normal. No wheezing or crackle Cardiovascular system: S1 & S2 heard, RRR.  No pedal edema. Gastrointestinal system: Abdomen is nondistended, soft and nontender. Normal bowel sounds heard. Central nervous system: Alert and oriented. No focal neurological deficits.Mild upper extremities tremor. Extremities: Symmetric 5 x 5 power. Skin: No rashes, lesions or ulcers Psychiatry: Judgement and insight appear normal. Mood & affect appropriate.     Data Reviewed: I have personally reviewed following labs and imaging studies  CBC:  Recent Labs Lab 05/29/16 1200 05/30/16 0354  WBC 15.0* 6.3  HGB 12.2 10.4*  HCT 36.7 31.2*  MCV 92.7 94.3  PLT 245 178   Basic Metabolic Panel:  Recent Labs Lab 05/29/16 1200 05/30/16 0354  NA 141 137  K 4.0 3.4*  CL 102 99*  CO2 22 27  GLUCOSE 83 80  BUN 7 7  CREATININE 0.91 0.87  CALCIUM 8.8* 8.4*   GFR: Estimated Creatinine Clearance: 82.9 mL/min (by C-G formula based on SCr of 0.87 mg/dL). Liver Function Tests:  Recent Labs Lab 05/29/16 1200 05/30/16 0354  AST 50* 32  ALT 28 21  ALKPHOS 65 51  BILITOT 1.0 0.9  PROT 7.9 6.4*  ALBUMIN 4.8 3.4*   No results for input(s): LIPASE, AMYLASE in the last 168 hours. No results for input(s): AMMONIA in the last 168 hours. Coagulation Profile: No results for input(s): INR, PROTIME in the last 168 hours. Cardiac Enzymes: No results for input(s): CKTOTAL, CKMB, CKMBINDEX, TROPONINI in the last  168 hours. BNP (last 3 results) No results for input(s): PROBNP in the last 8760 hours. HbA1C: No results for input(s): HGBA1C in the last 72 hours. CBG: No results for input(s): GLUCAP in the last 168 hours. Lipid Profile: No results for input(s): CHOL, HDL, LDLCALC, TRIG, CHOLHDL, LDLDIRECT in the last 72 hours. Thyroid Function Tests: No results for input(s): TSH, T4TOTAL, FREET4, T3FREE, THYROIDAB in the last 72 hours. Anemia Panel: No results for input(s): VITAMINB12, FOLATE, FERRITIN, TIBC, IRON, RETICCTPCT in the last 72 hours. Sepsis Labs: No results for input(s): PROCALCITON, LATICACIDVEN in the last 168 hours.  No results found for this or any previous visit (from the past 240 hour(s)).       Radiology Studies: No results found.      Scheduled Meds: . enoxaparin (LOVENOX) injection  40 mg Subcutaneous Q24H  . folic acid  1 mg Oral Daily  . multivitamin with minerals  1 tablet Oral Daily  . sertraline  75 mg Oral Daily  . sodium chloride flush  3 mL Intravenous Q12H  . thiamine  100 mg Oral Daily   Continuous Infusions: . 0.9 % NaCl with KCl 20 mEq / L       LOS: 0 days    Dron Jaynie Collins, MD [facility] Pager [phone]  If 7PM-7AM, please contact night-coverage [email or web address] Password [facility] 05/30/2016, 11:46 AM

## 2016-05-31 DIAGNOSIS — R740 Nonspecific elevation of levels of transaminase and lactic acid dehydrogenase [LDH]: Secondary | ICD-10-CM | POA: Diagnosis not present

## 2016-05-31 DIAGNOSIS — D72829 Elevated white blood cell count, unspecified: Secondary | ICD-10-CM | POA: Diagnosis not present

## 2016-05-31 DIAGNOSIS — F10230 Alcohol dependence with withdrawal, uncomplicated: Secondary | ICD-10-CM | POA: Diagnosis not present

## 2016-05-31 MED ORDER — ADULT MULTIVITAMIN W/MINERALS CH: 1.0000 | ORAL_TABLET | Freq: Every day | ORAL | Status: DC

## 2016-05-31 NOTE — Discharge Summary (Signed)
Physician Discharge Summary  Caitlin Brown ZOX:[id number] DOB: Sep 15, 1980 DOA: 05/29/2016  PCP: Allean Found, MD  Admit date: 05/29/2016 Discharge date: 05/31/2016  Admitted From:home Disposition:home. Then rehab center  Recommendations for Outpatient Follow-up:  1. Follow up with PCP in 1-2 weeks 2. Please obtain BMP/CBC in one week  Home Health:no Equipment/Devices:no Discharge Condition:stable CODE STATUS:full Diet recommendation:heart healthy  Brief/Interim Summary: 36 y.o.femalewith medical history significant for alcoholism who presents to the ER requesting detoxification. At time of presentation she was exhibiting signs of acute withdrawal with tremulousness, hypertension and signs of dehydration. Patient initially sought help at [facility] for alcohol detox but given the severity of her symptoms she was referred to the ER for treatment.  # Alcohol withdrawal (HCC): Clinically improved. No sign of withdrawal.  Education provided to the patient. Continue Vitamin. Patient reported that she will go to [facility] for alcohol detox after discharge from the hospital. She said her parents are working on arranging follow-ups.  # Transaminitis: Improved. #  Increased anion gap metabolic acidosis: Improved #  Leukocytosis: Improved #Hypokalemia: Repleted IV and oral potassium. Encourage oral intake. Recommended to monitor labs with PCP.  Patient is clinically stable. No sign of withdrawal. Motivated to detox alcohol and already has follow-up planned. At this time patient is medically stable to go home with outpatient follow-up.  Discharge Diagnoses:  Principal Problem:   Alcohol withdrawal (HCC) Active Problems:   Transaminitis   Increased anion gap metabolic acidosis   Leukocytosis    Discharge Instructions  Discharge Instructions    Call MD for:  difficulty breathing, headache or visual disturbances    Complete by:  As directed    Call MD for:  extreme  fatigue    Complete by:  As directed    Call MD for:  hives    Complete by:  As directed    Call MD for:  persistant dizziness or light-headedness    Complete by:  As directed    Call MD for:  persistant nausea and vomiting    Complete by:  As directed    Call MD for:  severe uncontrolled pain    Complete by:  As directed    Call MD for:  temperature >100.4    Complete by:  As directed    Diet - low sodium heart healthy    Complete by:  As directed    Discharge instructions    Complete by:  As directed    Please follow up at rehab center   Increase activity slowly    Complete by:  As directed      Allergies as of 05/31/2016   No Known Allergies     Medication List    TAKE these medications   acetaminophen 325 MG tablet Commonly known as:  TYLENOL Take 650 mg by mouth every 6 (six) hours as needed for mild pain.   multivitamin with minerals Tabs tablet Take 1 tablet by mouth daily. Start taking on:  06/01/2016   ondansetron 8 MG disintegrating tablet Commonly known as:  ZOFRAN ODT  ODT q8 hours prn nausea   sertraline 50 MG tablet Commonly known as:  ZOLOFT Take 75 mg by mouth at bedtime.      Follow-up Information    Allean Found, MD. Schedule an appointment as soon as possible for a visit in 1 week(s).   Specialty:  Family Medicine Contact information: [phone] [place] [address] [phone]  No Known Allergies  Consultations: None  Procedures/Studies: None  Subjective: Patient was seen and examined at bedside. She reported feeling better. Slept well. Denied headache, dizziness, nausea, vomiting, chest pain or shortness of breath.  Discharge Exam: Vitals:   05/31/16 0023 05/31/16 0505  BP: 135/90 (!) 141/99  Pulse: 79 67  Resp: 18 18  Temp: 98.2 F (36.8 C)    Vitals:   05/30/16 0502 05/30/16 1457 05/31/16 0023 05/31/16 0505  BP: (!) 136/93 (!) 142/84 135/90 (!) 141/99  Pulse: 67 87 79 67  Resp:  Temp: 97.9 F (36.6 C) 98.8 F (37.1 C) 98.2 F (36.8 C)   TempSrc: Oral  Oral   SpO2: 100% 99% 100% 100%  Weight:      Height:        General: Pt is alert, awake, not in acute distress Cardiovascular: RRR, S1/S2 +, no rubs, no gallops Respiratory: CTA bilaterally, no wheezing, no rhonchi Abdominal: Soft, NT, ND, bowel sounds + Extremities: no edema, no cyanosis, No tremor    The results of significant diagnostics from this hospitalization (including imaging, microbiology, ancillary and laboratory) are listed below for reference.     Microbiology: No results found for this or any previous visit (from the past 240 hour(s)).   Labs: BNP (last 3 results) No results for input(s): BNP in the last 8760 hours. Basic Metabolic Panel:  Recent Labs Lab 05/29/16 1200 05/30/16 0354  NA 141 137  K 4.0 3.4*  CL 102 99*  CO2 22 27  GLUCOSE 83 80  BUN 7 7  CREATININE 0.91 0.87  CALCIUM 8.8* 8.4*   Liver Function Tests:  Recent Labs Lab 05/29/16 1200 05/30/16 0354  AST 50* 32  ALT 28 21  ALKPHOS 65 51  BILITOT 1.0 0.9  PROT 7.9 6.4*  ALBUMIN 4.8 3.4*   No results for input(s): LIPASE, AMYLASE in the last 168 hours. No results for input(s): AMMONIA in the last 168 hours. CBC:  Recent Labs Lab 05/29/16 1200 05/30/16 0354  WBC 15.0* 6.3  HGB 12.2 10.4*  HCT 36.7 31.2*  MCV 92.7 94.3  PLT 245 178   Cardiac Enzymes: No results for input(s): CKTOTAL, CKMB, CKMBINDEX, TROPONINI in the last 168 hours. BNP: Invalid input(s): POCBNP CBG: No results for input(s): GLUCAP in the last 168 hours. D-Dimer No results for input(s): DDIMER in the last 72 hours. Hgb A1c No results for input(s): HGBA1C in the last 72 hours. Lipid Profile No results for input(s): CHOL, HDL, LDLCALC, TRIG, CHOLHDL, LDLDIRECT in the last 72 hours. Thyroid function studies No results for input(s): TSH, T4TOTAL, T3FREE, THYROIDAB in the last 72 hours.  Invalid input(s):  FREET3 Anemia work up No results for input(s): VITAMINB12, FOLATE, FERRITIN, TIBC, IRON, RETICCTPCT in the last 72 hours. Urinalysis No results found for: COLORURINE, APPEARANCEUR, LABSPEC, PHURINE, GLUCOSEU, HGBUR, BILIRUBINUR, KETONESUR, PROTEINUR, UROBILINOGEN, NITRITE, LEUKOCYTESUR Sepsis Labs Invalid input(s): PROCALCITONIN,  WBC,  LACTICIDVEN Microbiology No results found for this or any previous visit (from the past 240 hour(s)).   Time coordinating discharge: 26 minutes  SIGNED:   Maxie Barb, MD  [facility] 05/31/2016, 10:28 AM  If 7PM-7AM, please contact night-coverage [email or web address] Password [email or web address]

## 2016-05-31 NOTE — Progress Notes (Signed)
NURSING PROGRESS NOTE  Caitlin Brown [id number] Discharge Data: 05/31/2016 1:50 PM Attending Provider: Maxie Barb, MD [id number] Venetia Constable, MD     Lynnette Caffey to be D/C'd Home per MD order. Family is taking her to be admitted to [facility] program.  Discussed with the patient the After Visit Summary and all questions fully answered. All IV's discontinued with no bleeding noted. All belongings returned to patient for patient to take home.   Last Vital Signs:  Blood pressure (!) 141/99, pulse 67, temperature 98.2 F (36.8 C), temperature source Oral, resp. rate 18, height  (1.676 m), weight 68.2 kg (150 lb 5.7 oz), last menstrual period 05/14/2016, SpO2 100 %.  Discharge Medication List Allergies as of 05/31/2016   No Known Allergies     Medication List    TAKE these medications   acetaminophen 325 MG tablet Commonly known as:  TYLENOL Take 650 mg by mouth every 6 (six) hours as needed for mild pain.   multivitamin with minerals Tabs tablet Take 1 tablet by mouth daily. Start taking on:  06/01/2016   ondansetron 8 MG disintegrating tablet Commonly known as:  ZOFRAN ODT  ODT q8 hours prn nausea   sertraline 50 MG tablet Commonly known as:  ZOLOFT Take 75 mg by mouth at bedtime.

## 2016-08-28 ENCOUNTER — Telehealth (INDEPENDENT_AMBULATORY_CARE_PROVIDER_SITE_OTHER): Payer: Self-pay | Admitting: Obstetrics & Gynecology

## 2016-08-28 NOTE — Telephone Encounter (Signed)
Pt called into office today stating that she has hx of endometrosis and has had black discharge since yesterday along w/ headache, cramping. I told pt we don't have a provider in the office but if she isn't feeling good to go to urgent care or ER to evaluated. Pt is understanding. ...............................Marland Kitchen.Floyde ParkinsKelsey Dwight Burdo, CMA  08/28/2016, 16:33
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
## 2018-03-22 DIAGNOSIS — F321 Major depressive disorder, single episode, moderate: Secondary | ICD-10-CM | POA: Diagnosis not present

## 2018-03-22 DIAGNOSIS — G4700 Insomnia, unspecified: Secondary | ICD-10-CM | POA: Diagnosis not present

## 2019-07-11 DIAGNOSIS — Z0000 Encounter for general adult medical examination without abnormal findings: Secondary | ICD-10-CM | POA: Diagnosis not present

## 2019-07-11 DIAGNOSIS — F419 Anxiety disorder, unspecified: Secondary | ICD-10-CM | POA: Diagnosis not present

## 2019-07-11 DIAGNOSIS — F329 Major depressive disorder, single episode, unspecified: Secondary | ICD-10-CM | POA: Diagnosis not present

## 2019-07-11 DIAGNOSIS — G4700 Insomnia, unspecified: Secondary | ICD-10-CM | POA: Diagnosis not present

## 2019-07-11 DIAGNOSIS — Z13220 Encounter for screening for lipoid disorders: Secondary | ICD-10-CM | POA: Diagnosis not present

## 2019-10-24 DIAGNOSIS — T50995A Adverse effect of other drugs, medicaments and biological substances, initial encounter: Secondary | ICD-10-CM | POA: Diagnosis not present

## 2019-10-24 DIAGNOSIS — R609 Edema, unspecified: Secondary | ICD-10-CM | POA: Diagnosis not present

## 2019-11-20 DIAGNOSIS — Z20828 Contact with and (suspected) exposure to other viral communicable diseases: Secondary | ICD-10-CM | POA: Diagnosis not present

## 2020-01-05 DIAGNOSIS — N898 Other specified noninflammatory disorders of vagina: Secondary | ICD-10-CM | POA: Diagnosis not present

## 2020-01-05 DIAGNOSIS — T192XXA Foreign body in vulva and vagina, initial encounter: Secondary | ICD-10-CM | POA: Diagnosis not present

## 2020-01-05 DIAGNOSIS — N760 Acute vaginitis: Secondary | ICD-10-CM | POA: Diagnosis not present

## 2020-01-05 DIAGNOSIS — N390 Urinary tract infection, site not specified: Secondary | ICD-10-CM | POA: Diagnosis not present

## 2020-10-14 DIAGNOSIS — F321 Major depressive disorder, single episode, moderate: Secondary | ICD-10-CM | POA: Diagnosis not present

## 2020-10-14 DIAGNOSIS — E7800 Pure hypercholesterolemia, unspecified: Secondary | ICD-10-CM | POA: Diagnosis not present

## 2021-07-15 DIAGNOSIS — Z1231 Encounter for screening mammogram for malignant neoplasm of breast: Secondary | ICD-10-CM | POA: Diagnosis not present

## 2021-07-15 DIAGNOSIS — Z6825 Body mass index (BMI) 25.0-25.9, adult: Secondary | ICD-10-CM | POA: Diagnosis not present

## 2021-07-15 DIAGNOSIS — Z124 Encounter for screening for malignant neoplasm of cervix: Secondary | ICD-10-CM | POA: Diagnosis not present

## 2021-07-15 DIAGNOSIS — Z01419 Encounter for gynecological examination (general) (routine) without abnormal findings: Secondary | ICD-10-CM | POA: Diagnosis not present

## 2021-11-05 DIAGNOSIS — F321 Major depressive disorder, single episode, moderate: Secondary | ICD-10-CM | POA: Diagnosis not present

## 2021-11-05 DIAGNOSIS — M778 Other enthesopathies, not elsewhere classified: Secondary | ICD-10-CM | POA: Diagnosis not present

## 2021-11-05 DIAGNOSIS — G4700 Insomnia, unspecified: Secondary | ICD-10-CM | POA: Diagnosis not present

## 2021-11-05 DIAGNOSIS — E7800 Pure hypercholesterolemia, unspecified: Secondary | ICD-10-CM | POA: Diagnosis not present

## 2022-05-18 DIAGNOSIS — F419 Anxiety disorder, unspecified: Secondary | ICD-10-CM | POA: Diagnosis not present

## 2022-05-18 DIAGNOSIS — Z79899 Other long term (current) drug therapy: Secondary | ICD-10-CM | POA: Diagnosis not present

## 2022-05-18 DIAGNOSIS — F321 Major depressive disorder, single episode, moderate: Secondary | ICD-10-CM | POA: Diagnosis not present

## 2022-05-18 DIAGNOSIS — G4700 Insomnia, unspecified: Secondary | ICD-10-CM | POA: Diagnosis not present

## 2022-07-21 DIAGNOSIS — H18622 Keratoconus, unstable, left eye: Secondary | ICD-10-CM | POA: Diagnosis not present

## 2023-09-28 DIAGNOSIS — D72819 Decreased white blood cell count, unspecified: Secondary | ICD-10-CM | POA: Diagnosis not present

## 2023-09-28 DIAGNOSIS — G4700 Insomnia, unspecified: Secondary | ICD-10-CM | POA: Diagnosis not present

## 2023-09-28 DIAGNOSIS — F321 Major depressive disorder, single episode, moderate: Secondary | ICD-10-CM | POA: Diagnosis not present

## 2023-09-28 DIAGNOSIS — F419 Anxiety disorder, unspecified: Secondary | ICD-10-CM | POA: Diagnosis not present

## 2023-09-28 DIAGNOSIS — Z79899 Other long term (current) drug therapy: Secondary | ICD-10-CM | POA: Diagnosis not present

## 2023-12-17 ENCOUNTER — Inpatient Hospital Stay (HOSPITAL_COMMUNITY)
Admission: EM | Admit: 2023-12-17 | Discharge: 2023-12-21 | DRG: 897 | Disposition: A | Discharge status: Other Acute Inpatient Hospital | Attending: Internal Medicine | Admitting: Internal Medicine

## 2023-12-17 ENCOUNTER — Other Ambulatory Visit: Payer: Self-pay

## 2023-12-17 ENCOUNTER — Ambulatory Visit (HOSPITAL_COMMUNITY)
Admission: EM | Admit: 2023-12-17 | Discharge: 2023-12-17 | Disposition: A | Discharge status: Other Acute Inpatient Hospital

## 2023-12-17 ENCOUNTER — Encounter (HOSPITAL_COMMUNITY): Payer: Self-pay

## 2023-12-17 ENCOUNTER — Emergency Department (HOSPITAL_COMMUNITY): Payer: Self-pay

## 2023-12-17 DIAGNOSIS — Y908 Blood alcohol level of 240 mg/100 ml or more: Secondary | ICD-10-CM | POA: Diagnosis present

## 2023-12-17 DIAGNOSIS — R270 Ataxia, unspecified: Secondary | ICD-10-CM

## 2023-12-17 DIAGNOSIS — B9620 Unspecified Escherichia coli [E. coli] as the cause of diseases classified elsewhere: Secondary | ICD-10-CM | POA: Diagnosis present

## 2023-12-17 DIAGNOSIS — F10239 Alcohol dependence with withdrawal, unspecified: Principal | ICD-10-CM | POA: Diagnosis present

## 2023-12-17 DIAGNOSIS — F1020 Alcohol dependence, uncomplicated: Secondary | ICD-10-CM | POA: Insufficient documentation

## 2023-12-17 DIAGNOSIS — E860 Dehydration: Secondary | ICD-10-CM | POA: Diagnosis present

## 2023-12-17 DIAGNOSIS — Z9152 Personal history of nonsuicidal self-harm: Secondary | ICD-10-CM | POA: Diagnosis not present

## 2023-12-17 DIAGNOSIS — E872 Acidosis, unspecified: Secondary | ICD-10-CM | POA: Diagnosis present

## 2023-12-17 DIAGNOSIS — F10939 Alcohol use, unspecified with withdrawal, unspecified: Secondary | ICD-10-CM | POA: Diagnosis present

## 2023-12-17 DIAGNOSIS — G4700 Insomnia, unspecified: Secondary | ICD-10-CM | POA: Diagnosis present

## 2023-12-17 DIAGNOSIS — Z87891 Personal history of nicotine dependence: Secondary | ICD-10-CM | POA: Diagnosis not present

## 2023-12-17 DIAGNOSIS — F419 Anxiety disorder, unspecified: Secondary | ICD-10-CM | POA: Diagnosis present

## 2023-12-17 DIAGNOSIS — Z1152 Encounter for screening for COVID-19: Secondary | ICD-10-CM

## 2023-12-17 DIAGNOSIS — R45851 Suicidal ideations: Secondary | ICD-10-CM | POA: Insufficient documentation

## 2023-12-17 DIAGNOSIS — F19939 Other psychoactive substance use, unspecified with withdrawal, unspecified: Secondary | ICD-10-CM | POA: Diagnosis not present

## 2023-12-17 DIAGNOSIS — F332 Major depressive disorder, recurrent severe without psychotic features: Secondary | ICD-10-CM | POA: Diagnosis present

## 2023-12-17 DIAGNOSIS — I10 Essential (primary) hypertension: Secondary | ICD-10-CM | POA: Diagnosis present

## 2023-12-17 DIAGNOSIS — Z79899 Other long term (current) drug therapy: Secondary | ICD-10-CM | POA: Diagnosis not present

## 2023-12-17 DIAGNOSIS — R000 Tachycardia, unspecified: Secondary | ICD-10-CM | POA: Insufficient documentation

## 2023-12-17 DIAGNOSIS — N3000 Acute cystitis without hematuria: Secondary | ICD-10-CM | POA: Diagnosis present

## 2023-12-17 DIAGNOSIS — F329 Major depressive disorder, single episode, unspecified: Secondary | ICD-10-CM | POA: Diagnosis not present

## 2023-12-17 DIAGNOSIS — F10929 Alcohol use, unspecified with intoxication, unspecified: Secondary | ICD-10-CM | POA: Diagnosis not present

## 2023-12-17 DIAGNOSIS — G629 Polyneuropathy, unspecified: Secondary | ICD-10-CM | POA: Diagnosis present

## 2023-12-17 DIAGNOSIS — F10229 Alcohol dependence with intoxication, unspecified: Secondary | ICD-10-CM | POA: Diagnosis present

## 2023-12-17 DIAGNOSIS — N390 Urinary tract infection, site not specified: Principal | ICD-10-CM

## 2023-12-17 DIAGNOSIS — R569 Unspecified convulsions: Secondary | ICD-10-CM | POA: Insufficient documentation

## 2023-12-17 LAB — HCG, SERUM, QUALITATIVE: Preg, Serum: NEGATIVE

## 2023-12-17 LAB — URINALYSIS, W/ REFLEX TO CULTURE (INFECTION SUSPECTED)
Bilirubin Urine: NEGATIVE
Glucose, UA: NEGATIVE mg/dL
Ketones, ur: NEGATIVE mg/dL
Nitrite: POSITIVE — AB
Protein, ur: 30 mg/dL — AB
Specific Gravity, Urine: 1.01 (ref 1.005–1.030)
WBC, UA: >50 WBC/hpf (ref 0–5)
pH: 5 (ref 5.0–8.0)

## 2023-12-17 LAB — RESP PANEL BY RT-PCR (RSV, FLU A&B, COVID)  RVPGX2
Influenza A by PCR: NEGATIVE
Influenza B by PCR: NEGATIVE
Resp Syncytial Virus by PCR: NEGATIVE
SARS Coronavirus 2 by RT PCR: NEGATIVE

## 2023-12-17 LAB — COMPREHENSIVE METABOLIC PANEL WITH GFR
ALT: 43 U/L (ref 0–44)
AST: 55 U/L — ABNORMAL HIGH (ref 15–41)
Albumin: 4.1 g/dL (ref 3.5–5.0)
Alkaline Phosphatase: 57 U/L (ref 38–126)
Anion gap: 16 — ABNORMAL HIGH (ref 5–15)
BUN: 8 mg/dL (ref 6–20)
CO2: 23 mmol/L (ref 22–32)
Calcium: 8.3 mg/dL — ABNORMAL LOW (ref 8.9–10.3)
Chloride: 105 mmol/L (ref 98–111)
Creatinine, Ser: 0.7 mg/dL (ref 0.44–1.00)
GFR, Estimated: >60 mL/min (ref >60)
Glucose, Bld: 109 mg/dL — ABNORMAL HIGH (ref 70–99)
Potassium: 4 mmol/L (ref 3.5–5.1)
Sodium: 144 mmol/L (ref 135–145)
Total Bilirubin: 0.3 mg/dL (ref 0.0–1.2)
Total Protein: 7.4 g/dL (ref 6.5–8.1)

## 2023-12-17 LAB — I-STAT CG4 LACTIC ACID, ED: Lactic Acid, Venous: 2.3 mmol/L (ref 0.5–1.9)

## 2023-12-17 LAB — CBC
HCT: 38.5 % (ref 36.0–46.0)
Hemoglobin: 12.8 g/dL (ref 12.0–15.0)
MCH: 31.4 pg (ref 26.0–34.0)
MCHC: 33.2 g/dL (ref 30.0–36.0)
MCV: 94.6 fL (ref 80.0–100.0)
Platelets: 234 K/uL (ref 150–400)
RBC: 4.07 MIL/uL (ref 3.87–5.11)
RDW: 17.6 % — ABNORMAL HIGH (ref 11.5–15.5)
WBC: 4.7 K/uL (ref 4.0–10.5)
nRBC: 0 % (ref 0.0–0.2)

## 2023-12-17 LAB — ETHANOL: Alcohol, Ethyl (B): 365 mg/dL (ref <15)

## 2023-12-17 LAB — TSH: TSH: 1.299 u[IU]/mL (ref 0.350–4.500)

## 2023-12-17 LAB — PHOSPHORUS: Phosphorus: 3.7 mg/dL (ref 2.5–4.6)

## 2023-12-17 LAB — MAGNESIUM: Magnesium: 1.9 mg/dL (ref 1.7–2.4)

## 2023-12-17 LAB — RAPID URINE DRUG SCREEN, HOSP PERFORMED
Amphetamines: NOT DETECTED
Barbiturates: NOT DETECTED
Benzodiazepines: NOT DETECTED
Cocaine: NOT DETECTED
Opiates: NOT DETECTED
Tetrahydrocannabinol: POSITIVE — AB

## 2023-12-17 LAB — LIPASE, BLOOD: Lipase: 50 U/L (ref 11–51)

## 2023-12-17 MED ORDER — SODIUM CHLORIDE 0.9 % IV BOLUS
1000.0000 mL | Freq: Once | INTRAVENOUS | Status: AC
  Administered 2023-12-17: 1000 mL via INTRAVENOUS

## 2023-12-17 MED ORDER — ADULT MULTIVITAMIN W/MINERALS CH
1.0000 | ORAL_TABLET | Freq: Every day | ORAL | Status: DC
  Administered 2023-12-17 – 2023-12-21 (×5): 1 via ORAL
  Filled 2023-12-17 (×5): qty 1

## 2023-12-17 MED ORDER — THIAMINE MONONITRATE 100 MG PO TABS
100.0000 mg | ORAL_TABLET | Freq: Every day | ORAL | Status: DC
  Administered 2023-12-18 – 2023-12-20 (×2): 100 mg via ORAL
  Filled 2023-12-17 (×2): qty 1

## 2023-12-17 MED ORDER — LORAZEPAM 2 MG/ML IJ SOLN
0.0000 mg | Freq: Four times a day (QID) | INTRAMUSCULAR | Status: AC
  Administered 2023-12-17: 2 mg via INTRAVENOUS
  Administered 2023-12-18: 1 mg via INTRAVENOUS
  Administered 2023-12-18: 2 mg via INTRAVENOUS
  Administered 2023-12-18: 1 mg via INTRAVENOUS
  Administered 2023-12-19 (×2): 2 mg via INTRAVENOUS
  Administered 2023-12-19: 4 mg via INTRAVENOUS
  Filled 2023-12-17: qty 2
  Filled 2023-12-17 (×5): qty 1

## 2023-12-17 MED ORDER — LORAZEPAM 2 MG/ML IJ SOLN
0.0000 mg | Freq: Two times a day (BID) | INTRAMUSCULAR | Status: DC
  Administered 2023-12-19: 4 mg via INTRAVENOUS
  Filled 2023-12-17: qty 2

## 2023-12-17 MED ORDER — LORAZEPAM 2 MG/ML IJ SOLN
1.0000 mg | INTRAMUSCULAR | Status: AC | PRN
  Administered 2023-12-18: 2 mg via INTRAVENOUS
  Filled 2023-12-17: qty 1
  Filled 2023-12-17: qty 2
  Filled 2023-12-17: qty 1

## 2023-12-17 MED ORDER — SODIUM CHLORIDE 0.9 % IV SOLN
1.0000 g | Freq: Once | INTRAVENOUS | Status: AC
  Administered 2023-12-17: 1 g via INTRAVENOUS
  Filled 2023-12-17: qty 10

## 2023-12-17 MED ORDER — FOLIC ACID 1 MG PO TABS
1.0000 mg | ORAL_TABLET | Freq: Every day | ORAL | Status: DC
  Administered 2023-12-17 – 2023-12-21 (×5): 1 mg via ORAL
  Filled 2023-12-17 (×5): qty 1

## 2023-12-17 MED ORDER — LORAZEPAM 1 MG PO TABS
1.0000 mg | ORAL_TABLET | ORAL | Status: AC | PRN
  Administered 2023-12-18: 3 mg via ORAL
  Administered 2023-12-18: 1 mg via ORAL
  Administered 2023-12-18: 3 mg via ORAL
  Administered 2023-12-19 – 2023-12-20 (×3): 1 mg via ORAL
  Filled 2023-12-17: qty 1
  Filled 2023-12-17 (×2): qty 3
  Filled 2023-12-17 (×3): qty 1

## 2023-12-17 MED ORDER — THIAMINE HCL 100 MG/ML IJ SOLN
100.0000 mg | Freq: Every day | INTRAMUSCULAR | Status: DC
  Administered 2023-12-17 – 2023-12-19 (×2): 100 mg via INTRAVENOUS
  Filled 2023-12-17 (×2): qty 2

## 2023-12-17 NOTE — ED Triage Notes (Signed)
 Pt bib EMS from [facility]. Pt was sent to [facility] d/t suicidal ideation. Pt denies actual attempt. Pt stated that she fears that she is going to soon go through withdrawls. Pt denies headache, blurried vision, agitation. Complains of leg pain. Stated that she is certain she will soon be going through withdrawls. Stated that alcohol helps her not attempt suicide

## 2023-12-17 NOTE — ED Provider Notes (Signed)
 Practitioner notified by Krista Gails, RN that [facility] was onsite to transport pt, however, unable to transport to accepting facility, [facility] ED, due to elevated heart rate of 168. Pt reassessed. Pt alert and oriented, with facial flushing. HR 138-140 via pulse oximetry. HR was 138 on arrival to [facility]. [facility] staff state they have been advised by their supervisor to transport to closest ED. This practitioner contacted [facility] and pt report given to Dr Tegeler who accepts pt to [facility] ED.

## 2023-12-17 NOTE — Discharge Instructions (Addendum)
 Please transfer the patient to the [facility] ED for medical clearace

## 2023-12-17 NOTE — ED Provider Notes (Signed)
 HPI: Patient presented to the [facility] seemingly very intoxicated with complaints of passive SI and MDD.  Patient also has a significant history of alcohol abuse with dependence.    Patient reports history of seizures related to alcohol withdrawals, reports that she had a seizure earlier today, which was unwitnessed.  She also reports a history of DTs in the past, even though as per chart review this is not evident.  Heart rate is 138, having a blood pressure is not as elevated, and is currently 135/99.  We will err on the side of caution and transfer patient to the ER for medical stabilization if necessary and clearance, and patient can return back to the [facility] behavioral health center to continue detox at the facility based crisis center, after medical clearance.  Patient appears intoxicated, is erratic, and needing frequent redirections. Denies HI, denies AVH. Denies paranoia. Denies any other substance use other than alcohol.  Provider Handoff given to Dr Randol and the provider has agreed to accept the patient. The patient appears reasonably stabilized for transfer considering the current resources, flow, and capabilities available in the UC at this time, and I doubt any other [facility] requiring further screening and/or treatment in the UC prior to transfer is present. Dr Randol made aware that patient may return to the [facility] and resume treatment at the [facility] after medical treatment is needed, and clearance by the medical team.
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
## 2023-12-17 NOTE — ED Provider Notes (Signed)
 [place] EMERGENCY DEPARTMENT AT [facility] Provider Note   CSN: [id number] Arrival date & time: 12/17/23  2014     Patient presents with: Withdrawal   Caitlin Brown is a 43 y.o. female.   The history is provided by the patient and medical records (psychiatry team at [facility]).  Alcohol Intoxication This is a recurrent problem. The problem occurs constantly. The problem has not changed since onset.Pertinent negatives include no chest pain, no abdominal pain, no headaches and no shortness of breath. Nothing aggravates the symptoms. She has tried nothing for the symptoms. The treatment provided no relief.       Prior to Admission medications   Medication Sig Start Date End Date Taking? Authorizing Provider  acetaminophen  (TYLENOL ) 325 MG tablet Take 650 mg by mouth every 6 (six) hours as needed for mild pain.    [provider]  Multiple Vitamin (MULTIVITAMIN WITH MINERALS) TABS tablet Take 1 tablet by mouth daily. 06/01/16   Dolan Mateo Larger, MD  ondansetron  (ZOFRAN  ODT) 8 MG disintegrating tablet 8mg  ODT q8 hours prn nausea 05/14/16   Palumbo, April, MD  sertraline  (ZOLOFT ) 50 MG tablet Take 75 mg by mouth at bedtime.     [provider]    Allergies: Patient has no known allergies.    Review of Systems  Constitutional:  Positive for chills and fatigue. Negative for fever.  HENT:  Negative for congestion.   Eyes:  Negative for visual disturbance.  Respiratory:  Negative for cough, chest tightness, shortness of breath and wheezing.   Cardiovascular:  Negative for chest pain.  Gastrointestinal:  Positive for nausea. Negative for abdominal pain, constipation, diarrhea and vomiting.  Genitourinary:  Negative for dysuria and flank pain.  Musculoskeletal:  Negative for back pain, neck pain and neck stiffness.  Skin:  Negative for wound.  Neurological:  Positive for tremors. Negative for speech difficulty, weakness, light-headedness, numbness and  headaches. Seizures: history of. Psychiatric/Behavioral:  Positive for agitation. Negative for confusion.   All other systems reviewed and are negative.   Updated Vital Signs BP (!) 147/101   Pulse (!) 110   Temp 99 F (37.2 C)   Resp (!) 24   Ht 5' 6 (1.676 m)   Wt 68 kg   LMP 12/14/2023 (Exact Date)   BMI 24.21 kg/m   Physical Exam Vitals and nursing note reviewed.  Constitutional:      General: She is not in acute distress.    Appearance: She is well-developed. She is ill-appearing. She is not toxic-appearing or diaphoretic.  HENT:     Head: Normocephalic and atraumatic.     Nose: No congestion or rhinorrhea.     Mouth/Throat:     Mouth: Mucous membranes are dry.     Pharynx: No oropharyngeal exudate or posterior oropharyngeal erythema.  Eyes:     Extraocular Movements: Extraocular movements intact.     Conjunctiva/sclera: Conjunctivae normal.     Pupils: Pupils are equal, round, and reactive to light.  Cardiovascular:     Rate and Rhythm: Normal rate and regular rhythm.     Heart sounds: No murmur heard. Pulmonary:     Effort: Pulmonary effort is normal. No respiratory distress.     Breath sounds: Normal breath sounds. No wheezing, rhonchi or rales.  Chest:     Chest wall: No tenderness.  Abdominal:     Palpations: Abdomen is soft.     Tenderness: There is no abdominal tenderness.  Musculoskeletal:  General: No swelling or tenderness.     Cervical back: Neck supple.  Skin:    General: Skin is warm and dry.     Capillary Refill: Capillary refill takes less than 2 seconds.     Findings: No erythema or rash.  Neurological:     Mental Status: She is alert.     Sensory: No sensory deficit.     Motor: No weakness.  Psychiatric:        Mood and Affect: Mood is anxious and depressed.        Thought Content: Thought content includes suicidal ideation. Thought content does not include suicidal plan.     (all labs ordered are listed, but only abnormal  results are displayed) Labs Reviewed  CBC - Abnormal; Notable for the following components:      Result Value   RDW 17.6 (*)    All other components within normal limits  COMPREHENSIVE METABOLIC PANEL WITH GFR - Abnormal; Notable for the following components:   Glucose, Bld 109 (*)    Calcium 8.3 (*)    AST 55 (*)    Anion gap 16 (*)    All other components within normal limits  URINALYSIS, W/ REFLEX TO CULTURE (INFECTION SUSPECTED) - Abnormal; Notable for the following components:   APPearance HAZY (*)    Hgb urine dipstick MODERATE (*)    Protein, ur 30 (*)    Nitrite POSITIVE (*)    Leukocytes,Ua MODERATE (*)    Bacteria, UA MANY (*)    All other components within normal limits  RAPID URINE DRUG SCREEN, HOSP PERFORMED - Abnormal; Notable for the following components:   Tetrahydrocannabinol POSITIVE (*)    All other components within normal limits  ETHANOL - Abnormal; Notable for the following components:   Alcohol, Ethyl (B) 365 (*)    All other components within normal limits  I-STAT CG4 LACTIC ACID, ED - Abnormal; Notable for the following components:   Lactic Acid, Venous 2.3 (*)    All other components within normal limits  RESP PANEL BY RT-PCR (RSV, FLU A&B, COVID)  RVPGX2  URINE CULTURE  MAGNESIUM  PHOSPHORUS  LIPASE, BLOOD  HCG, SERUM, QUALITATIVE  TSH  I-STAT CG4 LACTIC ACID, ED    EKG: EKG Interpretation Date/Time:  Friday December 17 2023 21:39:56 EDT Ventricular Rate:  105 PR Interval:  107 QRS Duration:  78 QT Interval:  324 QTC Calculation: 429 R Axis:   81  Text Interpretation: Sinus tachycardia Probable left atrial enlargement when compared to prior, faster rate No sTEMI Confirmed by Ginger Barefoot ([id number]) on 12/17/2023 11:13:34 PM  Radiology: [facility] Chest Portable 1 View Result Date: 12/17/2023 CLINICAL DATA:  Tachycardia EXAM: PORTABLE CHEST 1 VIEW COMPARISON:  10/29/2003 FINDINGS: The heart size and mediastinal contours are within normal limits.  Both lungs are clear. The visualized skeletal structures are unremarkable. IMPRESSION: No active disease. Electronically Signed   By: Ozell Daring M.D.   On: 12/17/2023 22:33     Procedures   12/17/2023 CRITICAL CARE Performed by: Lonni PARAS Mabel Roll Total critical care time: 30 minutes Critical care time was exclusive of separately billable procedures and treating other patients. Critical care was necessary to treat or prevent imminent or life-threatening deterioration. Critical care was time spent personally by me on the following activities: development of treatment plan with patient and/or surrogate as well as nursing, discussions with consultants, evaluation of patient's response to treatment, examination of patient, obtaining history from patient or surrogate, ordering and performing  treatments and interventions, ordering and review of laboratory studies, ordering and review of radiographic studies, pulse oximetry and re-evaluation of patient's condition.   Medications Ordered in the ED  LORazepam  (ATIVAN ) tablet 1-4 mg (has no administration in time range)    Or  LORazepam  (ATIVAN ) injection 1-4 mg (has no administration in time range)  thiamine  (VITAMIN B1) tablet 100 mg ( Oral See Alternative 12/17/23 2212)    Or  thiamine  (VITAMIN B1) injection 100 mg (100 mg Intravenous Given 12/17/23 2212)  folic acid  (FOLVITE ) tablet 1 mg (1 mg Oral Given 12/17/23 2219)  multivitamin with minerals tablet 1 tablet (1 tablet Oral Given 12/17/23 2216)  LORazepam  (ATIVAN ) injection 0-4 mg (2 mg Intravenous Given 12/17/23 2214)    Followed by  LORazepam  (ATIVAN ) injection 0-4 mg (has no administration in time range)  sodium chloride  0.9 % bolus 1,000 mL (has no administration in time range)  cefTRIAXone (ROCEPHIN) 1 g in sodium chloride  0.9 % 100 mL IVPB (has no administration in time range)                                    Medical Decision Making Amount and/or Complexity of Data  Reviewed Labs: ordered. Radiology: ordered.  Risk OTC drugs. Prescription drug management. Decision regarding hospitalization.    Caitlin Brown is a 43 y.o. female with a past medical history significant for alcohol abuse, depression, and previous alcohol withdrawal seizures and treatments who presents from behavioral urgent care for further evaluation of suspected acute withdrawal and concerning vital signs.  According to patient, she has been drinking about 12 beers a day and had a large bottle of wine tonight.  She reports she has had suicidal thoughts for a while but does not have a acute plan how to kill herself.  She went to the behavioral health urgent care and was too tachycardic and they were concerned about withdrawal seizures so she was sent for evaluation.  Otherwise patient does report feeling agitated, some chills, and denies significant cough.  She denies congestion.  She denies any chest pain but does feel palpitations.  Patient does not have tactile hallucinations but she reports this is how she felt in the past when she had significant withdrawals.  She denies dysuria or urinary changes and denies constipation or diarrhea.  She denies recent trauma.  She is reporting pain in her extremities that feels similar to how she has when she had withdrawals.  On exam, patient is tachycardic and tachypneic.  Patient is tearful and slightly agitated.  Lungs were clear and chest was nontender.  Abdomen nontender.  She had no focal neurologic deficits on my exam but is tremulous and shaking.  Given her history of alcohol withdrawal seizures and her feeling like this I am somewhat concerned she is withdrawing despite the amount of alcohol she has been drinking.  We will order CIWA protocol and she will get some Ativan .  Her vital signs are also concerning for possible infection given the temperature of 99 tachycardia tachypnea and her appearance.  Will get screening labs to look for occult  infection.  Urinalysis clearly shows evidence of urinary tract infection.  Unclear if her vital sign changes are due to the UTI or from alcohol withdrawal.  Will give fluids and order antibiotics for her.      Given her suicidal ideation, reported alcohol withdrawal seizures [place] and her appearance on arrival,  anticipate she will likely admission for alcohol withdrawal monitoring and for further management of urinary tract infection.  X-ray does not show pneumonia.  Her viral swab does not show COVID flu RSV.  Her white count was normal and her lactic acid is slightly elevated at 2.3.  Due to the lactic acidosis, UTI, tachycardia and tachypnea on arrival, will call for admission for UTI with systemic symptoms.  I do feel she needs to be monitored for alcohol withdrawal and will likely also need inpatient psychiatric evaluation given her suicidal ideation.    Will call for admission.      Final diagnoses:  Urinary tract infection without hematuria, site unspecified  Acute alcoholic intoxication in alcoholism with complication (HCC)  Dehydration   Clinical Impression: 1. Urinary tract infection without hematuria, site unspecified   2. Acute alcoholic intoxication in alcoholism with complication (HCC)   3. Dehydration     Disposition: Admit  This note was prepared with assistance of Dragon voice recognition software. Occasional wrong-word or sound-a-like substitutions may have occurred due to the inherent limitations of voice recognition software.       Jett Kulzer, Lonni PARAS, MD 12/17/23 [id number]

## 2023-12-17 NOTE — Progress Notes (Addendum)
 Report called to I-Li,  RN CN [facility].

## 2023-12-17 NOTE — Progress Notes (Signed)
   12/17/23 1827  [facility] Triage Screening (Walk-ins at [facility] only)  How Did You Hear About Us ? Self  What Is the Reason for Your Visit/Call Today? Patient is a 42 year old female that presents this date voluntary to [facility] reporting ongoing passive SI for the last 48 hours. Patient denies any active plan or intent at the time of triage although states, I just want to die. Patient denies any HI or AVH. Patient has a PMHx significant for alcohol dependency per chart review with a hx of seizures. Patient states she consumes various amounts of alcohol daily with last use earlier this date when patient reported she drank, everything in her place. Patient is vague in reference to amounts consumed, time frame and length of use. Patient states she has also been diagnosed with depression, years ago, and has been taking 50 mg of Sertraline  for the last two years that is prescribed by her PCP at [facility]. Patient cannot recall that provider's name is observed to be very impaired at the time of triage. Patient stated she had a seizure earlier this date that, scared her, because she lives alone. Patient denies the use of any other substances.  How Long Has This Been Causing You Problems? > than 6 months  Have You Recently Had Any Thoughts About Hurting Yourself? No  How long ago did you have thoughts about hurting yourself? Patient voices passive SI stating she, just wants to die although denies any active plan or intent  Are You Planning to Commit Suicide/Harm Yourself At This time? No  Have you Recently Had Thoughts About Hurting Someone Sherral? No  Are You Planning To Harm Someone At This Time? No  Physical Abuse Denies  Verbal Abuse Denies  Sexual Abuse Denies  Exploitation of patient/patient's resources Denies  Self-Neglect Denies  Possible abuse reported to: Other (Comment) (NA)  Are you currently experiencing any auditory, visual or other hallucinations? No  Have You Used Any Alcohol or Drugs  in the Past 24 Hours? Yes  What Did You Use and How Much? Pt reports consuming various amounts of alcohol prior to arriving. Patient is observed to be actively impaired although is vague in reference to amounts used  Do you have any current medical co-morbidities that require immediate attention? No  Clinician description of patient physical appearance/behavior: Patient presents actively impaired  What Do You Feel Would Help You the Most Today? Alcohol or Drug Use Treatment  If access to [facility] was not available, would you have sought care in the Emergency Department? No  Determination of Need Routine (7 days)  Options For Referral Other: Comment (Pt to be transfered to [facility] due to seizure potential)

## 2023-12-17 NOTE — ED Notes (Signed)
 Report to Charge Nurse Jennifer/[facility], Accepting Dr Cleotis.

## 2023-12-18 DIAGNOSIS — F10239 Alcohol dependence with withdrawal, unspecified: Secondary | ICD-10-CM | POA: Diagnosis present

## 2023-12-18 DIAGNOSIS — F10930 Alcohol use, unspecified with withdrawal, uncomplicated: Secondary | ICD-10-CM

## 2023-12-18 LAB — I-STAT CG4 LACTIC ACID, ED: Lactic Acid, Venous: 1.6 mmol/L (ref 0.5–1.9)

## 2023-12-18 LAB — HIV ANTIBODY (ROUTINE TESTING W REFLEX): HIV Screen 4th Generation wRfx: NONREACTIVE

## 2023-12-18 LAB — CBC
HCT: 36 % (ref 36.0–46.0)
Hemoglobin: 11.8 g/dL — ABNORMAL LOW (ref 12.0–15.0)
MCH: 31.3 pg (ref 26.0–34.0)
MCHC: 32.8 g/dL (ref 30.0–36.0)
MCV: 95.5 fL (ref 80.0–100.0)
Platelets: 176 K/uL (ref 150–400)
RBC: 3.77 MIL/uL — ABNORMAL LOW (ref 3.87–5.11)
RDW: 17.4 % — ABNORMAL HIGH (ref 11.5–15.5)
WBC: 4.2 K/uL (ref 4.0–10.5)
nRBC: 0 % (ref 0.0–0.2)

## 2023-12-18 LAB — CREATININE, SERUM
Creatinine, Ser: 0.64 mg/dL (ref 0.44–1.00)
GFR, Estimated: >60 mL/min (ref >60)

## 2023-12-18 MED ORDER — SERTRALINE HCL 50 MG PO TABS
75.0000 mg | ORAL_TABLET | Freq: Every day | ORAL | Status: DC
Start: 2023-12-18 — End: 2023-12-21
  Administered 2023-12-18 – 2023-12-20 (×3): 75 mg via ORAL
  Filled 2023-12-18 (×3): qty 2

## 2023-12-18 MED ORDER — SODIUM CHLORIDE 0.9 % IV SOLN
1.0000 g | INTRAVENOUS | Status: DC
  Administered 2023-12-18 – 2023-12-19 (×2): 1 g via INTRAVENOUS
  Filled 2023-12-18 (×2): qty 10

## 2023-12-18 MED ORDER — SODIUM CHLORIDE 0.9 % IV SOLN: INTRAVENOUS | Status: DC

## 2023-12-18 MED ORDER — ENSURE PLUS HIGH PROTEIN PO LIQD
237.0000 mL | Freq: Two times a day (BID) | ORAL | Status: DC
  Administered 2023-12-19 – 2023-12-21 (×3): 237 mL via ORAL

## 2023-12-18 MED ORDER — ENOXAPARIN SODIUM 40 MG/0.4ML IJ SOSY
40.0000 mg | PREFILLED_SYRINGE | INTRAMUSCULAR | Status: DC
  Administered 2023-12-18 – 2023-12-21 (×3): 40 mg via SUBCUTANEOUS
  Filled 2023-12-18 (×3): qty 0.4

## 2023-12-18 NOTE — ED Notes (Signed)
 Patient sleeping

## 2023-12-18 NOTE — ED Notes (Signed)
 This RN to room to assess patient after notification from CCMD of HR 130s. Pt sitting up in bed, talking with visitor. Pt is tearful and appears anxious. Pt endorses visual and auditory hallucinations beginning to return along with nausea. Will administer PRN Ativan  for withdrawal symptoms. See MAR.

## 2023-12-18 NOTE — Plan of Care (Signed)
  Problem: Education: Goal: Knowledge of General Education information will improve Description: Including pain rating scale, medication(s)/side effects and non-pharmacologic comfort measures 12/18/2023 1814 by Casimir Keturah LABOR, RN Outcome: Progressing 12/18/2023 1558 by Casimir Keturah LABOR, RN Outcome: Progressing   Problem: Health Behavior/Discharge Planning: Goal: Ability to manage health-related needs will improve 12/18/2023 1814 by Casimir Keturah LABOR, RN Outcome: Progressing 12/18/2023 1558 by Casimir Keturah LABOR, RN Outcome: Progressing   Problem: Activity: Goal: Risk for activity intolerance will decrease 12/18/2023 1814 by Casimir Keturah LABOR, RN Outcome: Progressing 12/18/2023 1558 by Casimir Keturah LABOR, RN Outcome: Progressing   Problem: Nutrition: Goal: Adequate nutrition will be maintained 12/18/2023 1814 by Casimir Keturah LABOR, RN Outcome: Progressing 12/18/2023 1558 by Casimir Keturah LABOR, RN Outcome: Progressing

## 2023-12-18 NOTE — H&P (Addendum)
 History and Physical    Patient: Caitlin Brown FMW:[id number] DOB: Jan 30, 1981 DOA: 12/17/2023 DOS: the patient was seen and examined on 12/18/2023 PCP: Claudene Pellet, MD  Patient coming from: Home  Chief Complaint:  Chief Complaint  Patient presents with   Withdrawal   HPI: Caitlin HUEBERT is a 43 y.o. female with medical history significant of depression, alcohol abuse, previous alcohol withdrawal, and seizures who went to behavioral urgent care for evaluation of suicidal ideation and was sent to the ED due to concern for alcohol withdrawal (drinks a 12 pack of beer daily).   The patient presented with a history of experiencing suicidal thoughts, which have been associated with excessive alcohol consumption. The patient reported drinking two large bottles of wine per day, often supplemented with additional alcoholic beverages such as seltzers if the patient did not fall asleep first. The drinking occurs primarily on weekends in a binge pattern, with some alcohol consumption also occurring during the weekdays to maintain physical functioning. The patient described drinking up to a twelve-pack of seltzers in a norl workday/weekday and engaging in heavier drinking on days off from work. The patient has experienced similar episodes in the past but has not attempted self-harm and has sought help before reaching a critical point. While in the ED, the patient reported no longer experiencing suicidal thoughts.  In the ED, pt tachycardic on arrival and started on CIWA protocol. No fever or leukocytosis, ethanol level 365, UA with signs of infection (pyuria and bacteruria), and lactate 2.3> 1.6. Chest x-ray negative. EDP started IV ceftriaxone and 1L IV fluids prior to requesting medicine admission.    Review of Systems: As mentioned in the history of present illness. All other systems reviewed and are negative. Past Medical History:  Diagnosis Date   Depression    Past Surgical History:   Procedure Laterality Date   CESAREAN SECTION     Social History:  reports that she has quit smoking. She has never used smokeless tobacco. She reports current alcohol use of about 28.0 standard drinks of alcohol per week. She reports that she does not use drugs.  No Known Allergies  History reviewed. No pertinent family history.  Prior to Admission medications   Medication Sig Start Date End Date Taking? Authorizing Provider  acetaminophen  (TYLENOL ) 325 MG tablet Take 650 mg by mouth every 6 (six) hours as needed for mild pain.    [provider]  Multiple Vitamin (MULTIVITAMIN WITH MINERALS) TABS tablet Take 1 tablet by mouth daily. 06/01/16   Dolan Mateo Larger, MD  ondansetron  (ZOFRAN  ODT) 8 MG disintegrating tablet 8mg  ODT q8 hours prn nausea 05/14/16   Palumbo, April, MD  sertraline  (ZOLOFT ) 50 MG tablet Take 75 mg by mouth at bedtime.     [provider]    Physical Exam: Vitals:   12/18/23 0330 12/18/23 0400 12/18/23 0425 12/18/23 0738  BP: (!) 119/92 (!) 123/90 (!) 133/97 113/71  Pulse: 99 86 (!) 102 (!) 109  Resp:   17   Temp:   98.7 F (37.1 C)   TempSrc:   Oral   SpO2: 96% 92% 98%   Weight:      Height:       General: Alert, oriented x3, resting comfortably in no acute distress Respiratory: Lungs clear to auscultation bilaterally with normal respiratory effort; no w/r/r Cardiovascular: Regular rate and rhythm w/o m/r/g Abdomen: Soft, nontender, nondistended. Positive bowel sounds   Data Reviewed:  Lab Results  Component Value Date  WBC 4.7 12/17/2023   HGB 12.8 12/17/2023   HCT 38.5 12/17/2023   MCV 94.6 12/17/2023   PLT 234 12/17/2023   Lab Results  Component Value Date   GLUCOSE 109 (H) 12/17/2023   CALCIUM 8.3 (L) 12/17/2023   NA 144 12/17/2023   K 4.0 12/17/2023   CO2 23 12/17/2023   CL 105 12/17/2023   BUN 8 12/17/2023   CREATININE 0.70 12/17/2023   Lab Results  Component Value Date   ALT 43 12/17/2023   AST 55 (H)  12/17/2023   ALKPHOS 57 12/17/2023   BILITOT 0.3 12/17/2023   No results found for: INR, PROTIME Radiology: DG Chest Portable 1 View Result Date: 12/17/2023 CLINICAL DATA:  Tachycardia EXAM: PORTABLE CHEST 1 VIEW COMPARISON:  10/29/2003 FINDINGS: The heart size and mediastinal contours are within normal limits. Both lungs are clear. The visualized skeletal structures are unremarkable. IMPRESSION: No active disease. Electronically Signed   By: Ozell Daring M.D.   On: 12/17/2023 22:33    Assessment and Plan: 63F h/o depression, alcohol abuse, previous alcohol withdrawal, and seizures who went to behavioral urgent care for evaluation of suicidal ideation and was sent to the ED due to concern for alcohol withdrawal (drinks a 12 pack of alcoholic seltzers daily).   SI -Psych consulted (order placed on 10/18 and should be evaluated by the rounding team no later than 10/19); apprec eval/recs  Alcohol withdrawal  -IV/PO ativan , thiamine , and folate prn per CIWA protocol -MIVF: NS at 100cc/h for now  Acute cystitis Abnl UA UA with pyuria, bacteruria and nitirite positive -IV CTX 1g daily for now -F/u urine culture  Depression -Zoloft  75mg  daily   Advance Care Planning:   Code Status: Full Code   Consults: N/A  Family Communication: N/A  Severity of Illness: The appropriate patient status for this patient is INPATIENT. Inpatient status is judged to be reasonable and necessary in order to provide the required intensity of service to ensure the patient's safety. The patient's presenting symptoms, physical exam findings, and initial radiographic and laboratory data in the context of their chronic comorbidities is felt to place them at high risk for further clinical deterioration. Furthermore, it is not anticipated that the patient will be medically stable for discharge from the hospital within 2 midnights of admission.   * I certify that at the point of admission it is my clinical  judgment that the patient will require inpatient hospital care spanning beyond 2 midnights from the point of admission due to high intensity of service, high risk for further deterioration and high frequency of surveillance required.*   ------- I spent 60 minutes reviewing previous notes, at the bedside counseling/discussing the treatment plan, and performing clinical documentation.  Author: Marsha Ada, MD 12/18/2023 8:14 AM  For on call review [email or web address].

## 2023-12-18 NOTE — Plan of Care (Signed)

## 2023-12-18 NOTE — ED Notes (Signed)
 Pt woke up stating that she felt really sick, CIWAA score of 9. Pt still remains very cooperative

## 2023-12-18 NOTE — ED Notes (Signed)
 Pt ambulated to BR with steady gait.

## 2023-12-19 DIAGNOSIS — F10239 Alcohol dependence with withdrawal, unspecified: Secondary | ICD-10-CM | POA: Diagnosis not present

## 2023-12-19 DIAGNOSIS — F329 Major depressive disorder, single episode, unspecified: Secondary | ICD-10-CM

## 2023-12-19 DIAGNOSIS — F10229 Alcohol dependence with intoxication, unspecified: Secondary | ICD-10-CM | POA: Diagnosis present

## 2023-12-19 DIAGNOSIS — N390 Urinary tract infection, site not specified: Secondary | ICD-10-CM

## 2023-12-19 DIAGNOSIS — F332 Major depressive disorder, recurrent severe without psychotic features: Secondary | ICD-10-CM | POA: Diagnosis present

## 2023-12-19 LAB — GLUCOSE, CAPILLARY: Glucose-Capillary: 99 mg/dL (ref 70–99)

## 2023-12-19 LAB — URINE CULTURE: Culture: >=100000 — AB

## 2023-12-19 LAB — BASIC METABOLIC PANEL WITH GFR
Anion gap: 13 (ref 5–15)
BUN: <5 mg/dL — ABNORMAL LOW (ref 6–20)
CO2: 20 mmol/L — ABNORMAL LOW (ref 22–32)
Calcium: 8.2 mg/dL — ABNORMAL LOW (ref 8.9–10.3)
Chloride: 103 mmol/L (ref 98–111)
Creatinine, Ser: 0.71 mg/dL (ref 0.44–1.00)
GFR, Estimated: >60 mL/min (ref >60)
Glucose, Bld: 86 mg/dL (ref 70–99)
Potassium: 3.6 mmol/L (ref 3.5–5.1)
Sodium: 136 mmol/L (ref 135–145)

## 2023-12-19 NOTE — Progress Notes (Signed)
 PROGRESS NOTE   Caitlin Brown  FMW:[id number]    DOB: [birth year]    DOA: 12/17/2023  PCP: Claudene Pellet, MD   I have briefly reviewed patients previous medical records in [facility].   Brief Hospital Course:  43 year old female with medical history significant for depression, alcohol use disorder, previous alcohol withdrawal and related seizures, who went to behavioral urgent care for evaluation of suicidal ideation and was sent to the ED due to concern for alcohol withdrawal.  In ED, tachycardic.  Blood alcohol level 65.  Admitted for alcohol intoxication, alcohol withdrawal, suicidal ideations.  Improving.  Psychiatry consulted and plan inpatient behavioral health admission after medically cleared.  As per psychiatry, patient has agreed for voluntary admission at this time but if she attempts to leave the hospital, she will need to be involuntarily committed.  Hopefully will be medically stable for DC to [facility] tomorrow.   Assessment & Plan:   Alcohol use disorder Alcohol intoxication Alcohol withdrawal History of withdrawal seizures Presented to the ED with tachycardia up to 138, hypertension to 135/99.  Elevated lactate/2.3. Blood alcohol level: 365. Had high CIWA scores up to 16 on 10/18 afternoon.  Improved since then, 6 earlier today. Consulted remains at risk for continued withdrawal. Continue CIWA protocol.  Major depressive disorder, severe Suicidal ideations Psychiatry consultation 10/19 appreciated.  Continue Zoloft , CIWA protocol and they recommend inpatient psychiatric hospitalization when medically cleared. Continue 1: 1 Recruitment consultant.  Suicide precautions.  E. coli acute cystitis Pansensitive E. coli on urine culture.  Has completed 3 days of IV ceftriaxone, completed course, discontinued ceftriaxone.   Body mass index is 25.18 kg/m.   DVT prophylaxis: enoxaparin  (LOVENOX ) injection 40 mg Start: 12/18/23 0815     Code Status: Full Code:  Family  Communication: None at bedside. Disposition:  Status is: Inpatient Remains inpatient appropriate because: Ongoing CIWA protocol with concern for alcohol withdrawal.     Consultants:   Psychiatry  Procedures:     Subjective:  Patient reports feeling sad, tearful.  Currently denies suicidal ideations.  States that PTA, she drank what ever alcohol that she could get her hands on.  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  Lives alone and independent.  Nausea without vomiting.  No tremulousness, chest pain or palpitations.  Still reports brain fog.  Objective:   Vitals:   12/18/23 2130 12/19/23 0644 12/19/23 1003 12/19/23 1100  BP: (!) 144/96 (!) 115/92 (!) 138/99 (!) 135/90  Pulse: 99 74 90 91  Resp:  18 20   Temp:  98.5 F (36.9 C) 98.3 F (36.8 C)   TempSrc:   Oral   SpO2:  98% 99%   Weight:      Height:        General exam: Young female, moderately built and nourished sitting up comfortably in reclining chair.  Tearful.  Safety sitter just sitting outside her room. Respiratory system: Clear to auscultation. Respiratory effort normal. Cardiovascular system: S1 & S2 heard, RRR. No JVD, murmurs, rubs, gallops or clicks. No pedal edema.  Telemetry personally reviewed: Sinus rhythm.  Occasional and transient sinus tachycardia in the 120s. Gastrointestinal system: Abdomen is nondistended, soft and nontender. No organomegaly or masses felt. Normal bowel sounds heard. Central nervous system: Alert and oriented. No focal neurological deficits. Extremities: Symmetric 5 x 5 power. Skin: No rashes, lesions or ulcers Psychiatry: Judgement and insight appear normal. Mood & affect tearful.    Data Reviewed:   I have personally reviewed  following labs and imaging studies   CBC: Recent Labs  Lab 12/17/23 2219 12/18/23 0813  WBC 4.7 4.2  HGB 12.8 11.8*  HCT 38.5 36.0  MCV 94.6 95.5  PLT 234 176    Basic Metabolic Panel: Recent Labs  Lab 12/17/23 2219 12/18/23 0813  12/19/23 0440  NA 144  --  136  K 4.0  --  3.6  CL 105  --  103  CO2 23  --  20*  GLUCOSE 109*  --  86  BUN 8  --  <5*  CREATININE 0.70 0.64 0.71  CALCIUM 8.3*  --  8.2*  MG 1.9  --   --   PHOS 3.7  --   --     Liver Function Tests: Recent Labs  Lab 12/17/23 2219  AST 55*  ALT 43  ALKPHOS 57  BILITOT 0.3  PROT 7.4  ALBUMIN 4.1    CBG: No results for input(s): GLUCAP in the last 168 hours.  Microbiology Studies:   Recent Results (from the past 240 hours)  Urine Culture     Status: Abnormal   Collection Time: 12/17/23  9:41 PM   Specimen: Urine, Random  Result Value Ref Range Status   Specimen Description URINE, RANDOM  Final   Special Requests   Final    NONE Reflexed from [id number] Performed at [facility], [address] [place], [place] [address]    Culture >=100,000 COLONIES/mL ESCHERICHIA COLI (A)  Final   Report Status 12/19/2023 FINAL  Final   Organism ID, Bacteria ESCHERICHIA COLI (A)  Final      Susceptibility   Escherichia coli - MIC*    AMPICILLIN 4 SENSITIVE Sensitive     CEFAZOLIN (URINE) Value in next row Sensitive      <=1 SENSITIVEThis is a modified FDA-approved test that has been validated and its performance characteristics determined by the reporting laboratory.  This laboratory is certified under the Clinical Laboratory Improvement Amendments CLIA as qualified to perform high complexity clinical laboratory testing.    CEFEPIME Value in next row Sensitive      <=1 SENSITIVEThis is a modified FDA-approved test that has been validated and its performance characteristics determined by the reporting laboratory.  This laboratory is certified under the Clinical Laboratory Improvement Amendments CLIA as qualified to perform high complexity clinical laboratory testing.    ERTAPENEM Value in next row Sensitive      <=1 SENSITIVEThis is a modified FDA-approved test that has been validated and its performance characteristics determined by the  reporting laboratory.  This laboratory is certified under the Clinical Laboratory Improvement Amendments CLIA as qualified to perform high complexity clinical laboratory testing.    CEFTRIAXONE Value in next row Sensitive      <=1 SENSITIVEThis is a modified FDA-approved test that has been validated and its performance characteristics determined by the reporting laboratory.  This laboratory is certified under the Clinical Laboratory Improvement Amendments CLIA as qualified to perform high complexity clinical laboratory testing.    CIPROFLOXACIN Value in next row Sensitive      <=1 SENSITIVEThis is a modified FDA-approved test that has been validated and its performance characteristics determined by the reporting laboratory.  This laboratory is certified under the Clinical Laboratory Improvement Amendments CLIA as qualified to perform high complexity clinical laboratory testing.    GENTAMICIN Value in next row Sensitive      <=1 SENSITIVEThis is a modified FDA-approved test that has been validated and its performance characteristics determined  by the reporting laboratory.  This laboratory is certified under the Clinical Laboratory Improvement Amendments CLIA as qualified to perform high complexity clinical laboratory testing.    NITROFURANTOIN Value in next row Sensitive      <=1 SENSITIVEThis is a modified FDA-approved test that has been validated and its performance characteristics determined by the reporting laboratory.  This laboratory is certified under the Clinical Laboratory Improvement Amendments CLIA as qualified to perform high complexity clinical laboratory testing.    TRIMETH/SULFA Value in next row Sensitive      <=1 SENSITIVEThis is a modified FDA-approved test that has been validated and its performance characteristics determined by the reporting laboratory.  This laboratory is certified under the Clinical Laboratory Improvement Amendments CLIA as qualified to perform high complexity clinical  laboratory testing.    AMPICILLIN/SULBACTAM Value in next row Sensitive      <=1 SENSITIVEThis is a modified FDA-approved test that has been validated and its performance characteristics determined by the reporting laboratory.  This laboratory is certified under the Clinical Laboratory Improvement Amendments CLIA as qualified to perform high complexity clinical laboratory testing.    PIP/TAZO Value in next row Sensitive      <=4 SENSITIVEThis is a modified FDA-approved test that has been validated and its performance characteristics determined by the reporting laboratory.  This laboratory is certified under the Clinical Laboratory Improvement Amendments CLIA as qualified to perform high complexity clinical laboratory testing.    MEROPENEM Value in next row Sensitive      <=4 SENSITIVEThis is a modified FDA-approved test that has been validated and its performance characteristics determined by the reporting laboratory.  This laboratory is certified under the Clinical Laboratory Improvement Amendments CLIA as qualified to perform high complexity clinical laboratory testing.    * >=100,000 COLONIES/mL ESCHERICHIA COLI  Resp panel by RT-PCR (RSV, Flu A&B, Covid) Urine, Clean Catch     Status: None   Collection Time: 12/17/23  9:49 PM   Specimen: Urine, Clean Catch; Nasal Swab  Result Value Ref Range Status   SARS Coronavirus 2 by RT PCR NEGATIVE NEGATIVE Final   Influenza A by PCR NEGATIVE NEGATIVE Final   Influenza B by PCR NEGATIVE NEGATIVE Final    Comment: (NOTE) The Xpert Xpress SARS-CoV-2/FLU/RSV plus assay is intended as an aid in the diagnosis of influenza from Nasopharyngeal swab specimens and should not be used as a sole basis for treatment. Nasal washings and aspirates are unacceptable for Xpert Xpress SARS-CoV-2/FLU/RSV testing.  Fact Sheet for Patients: BloggerCourse.com  Fact Sheet for Healthcare Providers: SeriousBroker.it  This  test is not yet approved or cleared by the [place]  FDA and has been authorized for detection and/or diagnosis of SARS-CoV-2 by FDA under an Emergency Use Authorization (EUA). This EUA will remain in effect (meaning this test can be used) for the duration of the COVID-19 declaration under Section 564(b)(1) of the Act, 21 U.S.C. section 360bbb-3(b)(1), unless the authorization is terminated or revoked.     Resp Syncytial Virus by PCR NEGATIVE NEGATIVE Final    Comment: (NOTE) Fact Sheet for Patients: BloggerCourse.com  Fact Sheet for Healthcare Providers: SeriousBroker.it  This test is not yet approved or cleared by the [place]  FDA and has been authorized for detection and/or diagnosis of SARS-CoV-2 by FDA under an Emergency Use Authorization (EUA). This EUA will remain in effect (meaning this test can be used) for the duration of the COVID-19 declaration under Section 564(b)(1) of the Act, 21 U.S.C. section 360bbb-3(b)(1), unless the authorization  is terminated or revoked.  Performed at [facility], [address] [place], [place] [address]     Radiology Studies:  DG Chest Portable 1 View Result Date: 12/17/2023 CLINICAL DATA:  Tachycardia EXAM: PORTABLE CHEST 1 VIEW COMPARISON:  10/29/2003 FINDINGS: The heart size and mediastinal contours are within normal limits. Both lungs are clear. The visualized skeletal structures are unremarkable. IMPRESSION: No active disease. Electronically Signed   By: Ozell Daring M.D.   On: 12/17/2023 22:33    Scheduled Meds:    enoxaparin  (LOVENOX ) injection  40 mg Subcutaneous Q24H   feeding supplement  237 mL Oral BID BM   folic acid   1 mg Oral Daily   LORazepam   0-4 mg Intravenous Q6H   Followed by   LORazepam   0-4 mg Intravenous Q12H   multivitamin with minerals  1 tablet Oral Daily   sertraline   75 mg Oral QHS   thiamine   100 mg Oral Daily   Or   thiamine   100 mg  Intravenous Daily    Continuous Infusions:    cefTRIAXone (ROCEPHIN)  IV 1 g (12/19/23 0842)     LOS: 1 day     Trenda Mar, MD,  FACP, [facility], [facility], [facility]   [facility] [place]      To contact the attending provider between 7A-7P or the covering provider during after hours 7P-7A, please log into the web site [email or web address] and access using universal [place] password for that web site. If you do not have the password, please call the hospital operator.  12/19/2023, 2:46 PM

## 2023-12-19 NOTE — Consult Note (Signed)
 [facility] Psychiatric Consult Initial  Patient Name: .Caitlin Brown  MRN: [id number]  DOB: 05-27-1980  Consult Order details:  Orders (From admission, onward)     Start     Ordered   12/18/23 1228  IP CONSULT TO PSYCHIATRY       Ordering Provider: Georgina Basket, MD  Provider:  (Not yet assigned)  Question Answer Comment  Location [facility]   Reason for Consult? SI      12/18/23 1227             Mode of Visit: In person    Psychiatry Consult Evaluation  Service Date: December 19, 2023 LOS:  LOS: 1 day  Chief Complaint SUicidal ideations  Primary Psychiatric Diagnoses  MDD severe 2.  Moderate alcohol use disorder 3.    Assessment  Caitlin Brown is a 43 y.o. female admitted: Medicallyfor 12/17/2023  8:14 PM for alcohol use disorder and complications. She carries the psychiatric diagnoses of MDD, GAD and AIUD and has a past medical history of  seizures.   Her current presentation of tearfulness, low mood, hopelessness, guilt, and increased alcohol use is most consistent with a Major Depressive Episode in the context of Alcohol Use Disorder, severe. She meets criteria for Major Depressive Disorder, recurrent, moderate to severe, based on persistent depressive symptoms lasting more than two weeks, anhedonia, insomnia, decreased appetite, feelings of worthlessness and guilt, and recurrent thoughts of death that intensify during alcohol use.  Current outpatient psychotropic medication includes sertraline  (Zoloft ), prescribed by her primary care provider, and historically she has had a partial but generally positive response to this medication. She was partially compliant with her medication prior to admission, as evidenced by self-reported adherence with intermittent missed doses during periods of alcohol relapse.  On initial examination, the patient appeared alert, oriented, cooperative, and tearful with depressed mood and congruent affect. Thought process was  linear and goal-directed with no evidence of psychosis. She denied current suicidal ideation, though acknowledged passive fleeting thoughts in the past when intoxicated. Insight and judgment were limited regarding alcohol use.  The patient presented with suicidal thoughts in the context of heavy alcohol use and withdrawal symptoms. She reported long-standing depression with worsening mood, poor coping, and increased alcohol intake over the past several months. Her alcohol consumption pattern includes binge drinking on weekends and maintenance drinking during weekdays to prevent withdrawal. Despite recent suicidal ideation, she denied current intent or plan once sober and medically stabilized. Her presentation is consistent with alcohol-induced mood disturbance superimposed on recurrent major depressive disorder.  Please see plan below for detailed recommendations.  Diagnoses:  Active Hospital problems: Principal Problem:   Alcohol withdrawal (HCC)    Plan   ## Psychiatric Medication Recommendations:  -Continue Zoloft   -Continue CIWA -Continue Alcohol detox protocol  ## Medical Decision Making Capacity: Not specifically addressed in this encounter  ## Further Work-up:  -- B12, folate and TSH TSH, B12, folate, EKG, While pt on Qtc prolonging medications, please monitor & replete K+ to 4 and Mg2+ to 2, TOC consult for substance abuse resources, U/A, or UDS -- most recent EKG on 10/17 had QtC of 429 -- Pertinent labwork reviewed earlier this admission includes: BAL 365, Lactic 2.3   ## Disposition:-- We recommend inpatient psychiatric hospitalization when medically cleared. Patient is under voluntary admission status at this time; please IVC if attempts to leave hospital. She has consented to go voluntary for treatment.   ## Behavioral / Environmental: - No specific recommendations  at this time.     ## Safety and Observation Level:  - Based on my clinical evaluation, I estimate the  patient to be at moderate risk of self harm in the current setting. - At this time, we recommend  1:1 Observation. This decision is based on my review of the chart including patient's history and current presentation, interview of the patient, mental status examination, and consideration of suicide risk including evaluating suicidal ideation, plan, intent, suicidal or self-harm behaviors, risk factors, and protective factors. This judgment is based on our ability to directly address suicide risk, implement suicide prevention strategies, and develop a safety plan while the patient is in the clinical setting. Please contact our team if there is a concern that risk level has changed.  CSSR Risk Category:C-SSRS RISK CATEGORY: High Risk  Suicide Risk Assessment: Patient has following modifiable risk factors for suicide: untreated depression, under treated depression , social isolation, recklessness, active mental illness (to encompass adhd, tbi, mania, psychosis, trauma reaction), current symptoms: anxiety/panic, insomnia, impulsivity, anhedonia, hopelessness, and triggering events, which we are addressing by inpatient psych and medication management. Patient has following non-modifiable or demographic risk factors for suicide: separation or divorce and history of self harm behavior Patient has the following protective factors against suicide: Access to outpatient mental health care, Supportive family, Supportive friends, Cultural, spiritual, or religious beliefs that discourage suicide, Pets in the home, Frustration tolerance, no history of suicide attempts, and no history of NSSIB  Thank you for this consult request. Recommendations have been communicated to the primary team.  We will continue to follow at this time.   Majel GORMAN Ramp, FNP       History of Present Illness  Relevant Aspects of [facility] Course:  Admitted on 12/17/2023 with a history of major depressive disorder, alcohol  use disorder, prior alcohol withdrawal, and seizure history who initially presented to Behavioral Health Urgent Care with suicidal ideation and was transferred to the Emergency Department for evaluation and management of possible alcohol withdrawal. On arrival, she was tachycardic and started on a CIWA protocol for alcohol withdrawal management. Laboratory work revealed ethanol level of 365 mg/dL, urinalysis with pyuria and bacteriuria, and lactate of 2.3 decreasing to 1.6. Chest x-ray was negative for acute process. She received IV ceftriaxone and 1 liter of IV fluids prior to admission to the medical floor. There were no fevers or leukocytosis noted.  Patient Report:  Patient reports drinking two large bottles of wine daily, sometimes supplemented with seltzers if she remains awake. She describes her weekday pattern as consuming up to a twelve-pack of seltzers to "keep from shaking" and heavier drinking on weekends. She acknowledges feeling depressed, hopeless, and frustrated about her alcohol use and its consequences, including strained family relationships and recurrent hospitalizations. She denies current suicidal ideation or intent but admits to fleeting suicidal thoughts when intoxicated. She reports fatigue, insomnia, decreased appetite, and guilt over her inability to remain abstinent. She states she has experienced similar episodes in the past and typically seeks help before her symptoms escalate. Currently, she expresses motivation to stabilize medically and work toward recovery but notes limited social supports and difficulty maintaining sobriety.  Psych ROS:  Depression: Yes worthlessness, guilty, insomnia, sadness, hopeless, suicidal ideation passive, tearfulness Anxiety:  Yes excessive worry Mania (lifetime and current): Denies Psychosis: (lifetime and current):Denies  Collateral information:  Patient needed phone to get phone numbers for collateral.   Review of Systems  All other  systems reviewed and are negative.  Psychiatric and Social History  Psychiatric History:  Information collected from Patient and chart review  Prev Dx/Sx: Depression and anxiety Current Psych Provider: None Home Meds (current): Zoloft  Previous Med Trials: Zoloft  Therapy: Denies   Prior Psych Hospitalization: Denies  Prior Self Harm: Denies Prior Violence: Denies  Family Psych History: Denies Family Hx suicide: Denies  Social History:  Developmental Hx: WNL Educational Hx: Bachelors Occupational Hx: Careers information officer Hx: 2 DWI. None currently Living Situation: Lives alone in an apartment Spiritual Hx: Catholic Access to weapons/lethal means: Denies   Substance History Alcohol: Yes Type of alcohol WIne and seltzers Last Drink Friday Number of drinks per day Binge drinking (2) 1.75 L bottles History of alcohol withdrawal seizures Denies History of DT's Denies Tobacco: Denies Illicit drugs: Denies Prescription drug abuse: Denies Rehab hx: 2018 inpatient residential  Exam Findings  Physical Exam: Age appropriate caucasian female, lying in bed in fetal position.  Vital Signs:  Temp:  [98.3 F (36.8 C)-99 F (37.2 C)] 98.3 F (36.8 C) (10/19 1003) Pulse Rate:  [74-123] 90 (10/19 1003) Resp:  [15-29] 20 (10/19 1003) BP: (115-157)/(92-111) 138/99 (10/19 1003) SpO2:  [95 %-100 %] 99 % (10/19 1003) Weight:  [70.8 kg] 70.8 kg (10/18 1516) Blood pressure (!) 138/99, pulse 90, temperature 98.3 F (36.8 C), temperature source Oral, resp. rate 20, height 5' 6 (1.676 m), weight 70.8 kg, last menstrual period 12/14/2023, SpO2 99%. Body mass index is 25.18 kg/m.  Physical Exam Vitals reviewed.  Constitutional:      Appearance: Normal appearance. She is normal weight.  Neurological:     General: No focal deficit present.     Mental Status: She is alert and oriented to person, place, and time. Mental status is at baseline.  Psychiatric:        Mood and  Affect: Mood normal.        Behavior: Behavior normal.        Thought Content: Thought content normal.        Judgment: Judgment normal.     Mental Status Exam: General Appearance: Casual and Fairly Groomed  Orientation:  Full (Time, Place, and Person)  Memory:  Immediate;   Good Recent;   Good  Concentration:  Concentration: Fair and Attention Span: Good  Recall:  Good  Attention  Fair  Eye Contact:  Good  Speech:  Clear and Coherent and Slow  Language:  Fair  Volume:  Decreased  Mood: sad  Affect:  Appropriate and Congruent  Thought Process:  Coherent, Goal Directed, Linear, and Descriptions of Associations: Intact  Thought Content:  Logical  Suicidal Thoughts:  No  Homicidal Thoughts:  No  Judgement:  Fair  Insight:  Fair  Psychomotor Activity:  Normal  Akathisia:  No  Fund of Knowledge:  Fair      Assets:  Manufacturing systems engineer Desire for Improvement Financial Resources/Insurance Housing Intimacy Leisure Time Physical Health Resilience Social Support Talents/Skills Transportation Vocational/Educational  Cognition:  WNL  ADL's:  Intact  AIMS (if indicated):        Other History   These have been pulled in through the EMR, reviewed, and updated if appropriate.  Family History:  The patient's family history is not on file.  Medical History: Past Medical History:  Diagnosis Date   Depression     Surgical History: Past Surgical History:  Procedure Laterality Date   CESAREAN SECTION       Medications:   Current Facility-Administered Medications:    cefTRIAXone (ROCEPHIN) 1  g in sodium chloride  0.9 % 100 mL IVPB, 1 g, Intravenous, Q24H, Georgina Basket, MD, Last Rate: 200 mL/hr at 12/19/23 0842, 1 g at 12/19/23 9157   enoxaparin  (LOVENOX ) injection 40 mg, 40 mg, Subcutaneous, Q24H, Georgina Basket, MD, 40 mg at 12/19/23 0841   feeding supplement (ENSURE PLUS HIGH PROTEIN) liquid 237 mL, 237 mL, Oral, BID BM, Georgina Basket, MD, 237 mL at 12/19/23 0902    folic acid  (FOLVITE ) tablet 1 mg, 1 mg, Oral, Daily, Tegeler, Lonni PARAS, MD, 1 mg at 12/19/23 9153   LORazepam  (ATIVAN ) injection 0-4 mg, 0-4 mg, Intravenous, Q6H, 2 mg at 12/19/23 0846 **FOLLOWED BY** LORazepam  (ATIVAN ) injection 0-4 mg, 0-4 mg, Intravenous, Q12H, Tegeler, Lonni PARAS, MD   LORazepam  (ATIVAN ) tablet 1-4 mg, 1-4 mg, Oral, Q1H PRN, 1 mg at 12/19/23 1013 **OR** LORazepam  (ATIVAN ) injection 1-4 mg, 1-4 mg, Intravenous, Q1H PRN, Tegeler, Lonni PARAS, MD, 2 mg at 12/18/23 0742   multivitamin with minerals tablet 1 tablet, 1 tablet, Oral, Daily, Tegeler, Lonni PARAS, MD, 1 tablet at 12/19/23 0846   sertraline  (ZOLOFT ) tablet 75 mg, 75 mg, Oral, QHS, Georgina Basket, MD, 75 mg at 12/18/23 2114   thiamine  (VITAMIN B1) tablet 100 mg, 100 mg, Oral, Daily, 100 mg at 12/18/23 0958 **OR** thiamine  (VITAMIN B1) injection 100 mg, 100 mg, Intravenous, Daily, Tegeler, Lonni PARAS, MD, 100 mg at 12/19/23 0847  Allergies: No Known Allergies  Majel GORMAN Ramp, FNP

## 2023-12-19 NOTE — Plan of Care (Signed)

## 2023-12-20 DIAGNOSIS — F10939 Alcohol use, unspecified with withdrawal, unspecified: Secondary | ICD-10-CM

## 2023-12-20 DIAGNOSIS — E519 Thiamine deficiency, unspecified: Secondary | ICD-10-CM

## 2023-12-20 DIAGNOSIS — E538 Deficiency of other specified B group vitamins: Secondary | ICD-10-CM

## 2023-12-20 DIAGNOSIS — G621 Alcoholic polyneuropathy: Secondary | ICD-10-CM

## 2023-12-20 DIAGNOSIS — H538 Other visual disturbances: Secondary | ICD-10-CM

## 2023-12-20 LAB — COMPREHENSIVE METABOLIC PANEL WITH GFR
ALT: 65 U/L — ABNORMAL HIGH (ref 0–44)
AST: 76 U/L — ABNORMAL HIGH (ref 15–41)
Albumin: 4 g/dL (ref 3.5–5.0)
Alkaline Phosphatase: 51 U/L (ref 38–126)
Anion gap: 12 (ref 5–15)
BUN: 6 mg/dL (ref 6–20)
CO2: 19 mmol/L — ABNORMAL LOW (ref 22–32)
Calcium: 8.8 mg/dL — ABNORMAL LOW (ref 8.9–10.3)
Chloride: 103 mmol/L (ref 98–111)
Creatinine, Ser: 0.69 mg/dL (ref 0.44–1.00)
GFR, Estimated: >60 mL/min (ref >60)
Glucose, Bld: 82 mg/dL (ref 70–99)
Potassium: 3.5 mmol/L (ref 3.5–5.1)
Sodium: 134 mmol/L — ABNORMAL LOW (ref 135–145)
Total Bilirubin: 0.8 mg/dL (ref 0.0–1.2)
Total Protein: 7.4 g/dL (ref 6.5–8.1)

## 2023-12-20 LAB — CBC
HCT: 38.6 % (ref 36.0–46.0)
Hemoglobin: 13.3 g/dL (ref 12.0–15.0)
MCH: 31.7 pg (ref 26.0–34.0)
MCHC: 34.5 g/dL (ref 30.0–36.0)
MCV: 91.9 fL (ref 80.0–100.0)
Platelets: 174 K/uL (ref 150–400)
RBC: 4.2 MIL/uL (ref 3.87–5.11)
RDW: 15.8 % — ABNORMAL HIGH (ref 11.5–15.5)
WBC: 4 K/uL (ref 4.0–10.5)
nRBC: 0 % (ref 0.0–0.2)

## 2023-12-20 LAB — FOLATE: Folate: >20 ng/mL (ref >5.9)

## 2023-12-20 LAB — VITAMIN B12: Vitamin B-12: 453 pg/mL (ref 180–914)

## 2023-12-20 LAB — MAGNESIUM: Magnesium: 1.8 mg/dL (ref 1.7–2.4)

## 2023-12-20 MED ORDER — IBUPROFEN 400 MG PO TABS
400.0000 mg | ORAL_TABLET | Freq: Once | ORAL | Status: AC
  Administered 2023-12-20: 400 mg via ORAL
  Filled 2023-12-20: qty 1

## 2023-12-20 MED ORDER — HYDRALAZINE HCL 20 MG/ML IJ SOLN
10.0000 mg | Freq: Four times a day (QID) | INTRAMUSCULAR | Status: DC | PRN
  Administered 2023-12-20: 10 mg via INTRAVENOUS
  Filled 2023-12-20: qty 1

## 2023-12-20 MED ORDER — THIAMINE HCL 100 MG/ML IJ SOLN
500.0000 mg | Freq: Three times a day (TID) | INTRAVENOUS | Status: DC
  Administered 2023-12-20 – 2023-12-21 (×3): 500 mg via INTRAVENOUS
  Filled 2023-12-20: qty 5
  Filled 2023-12-20: qty 500
  Filled 2023-12-20: qty 5
  Filled 2023-12-20: qty 500
  Filled 2023-12-20 (×2): qty 5

## 2023-12-20 MED ORDER — THIAMINE HCL 100 MG/ML IJ SOLN: 100.0000 mg | Freq: Every day | INTRAMUSCULAR | Status: DC

## 2023-12-20 MED ORDER — POTASSIUM CHLORIDE CRYS ER 20 MEQ PO TBCR
40.0000 meq | EXTENDED_RELEASE_TABLET | Freq: Once | ORAL | Status: AC
  Administered 2023-12-20: 40 meq via ORAL
  Filled 2023-12-20: qty 4

## 2023-12-20 MED ORDER — THIAMINE HCL 100 MG/ML IJ SOLN: 250.0000 mg | Freq: Every day | INTRAVENOUS | Status: DC

## 2023-12-20 NOTE — Plan of Care (Signed)

## 2023-12-20 NOTE — TOC Initial Note (Addendum)
 Transition of Care [facility]) - Initial/Assessment Note    Patient Details  Name: Caitlin Brown MRN: [id number] Date of Birth: [birth year]  [facility] CM/SW Contact:    Lendia Dais, LCSWA Phone Number: 12/20/2023, 1:10 PM  Clinical Narrative:  Pt is from home alone and is independent with ADL's. Patient reports no use DME and has seen PCP in the last year. Pt does have a HCPOA which is the pt's previous spouse Dorise. HCPOA is not on file.  Pt has a Hx of anxiety and depression and is agreeable to psych recs of inpatient psych. CSW explained the referral process for inpatient psych. Arvis is agreeable to discharging to [facility]. CSW explained that pt will go by safe transport.  CSW will continue to follow.           Expected Discharge Plan: Psychiatric Hospital Barriers to Discharge: Continued Medical Work up, Psych Bed not available   Patient Goals and CMS Choice Patient states their goals for this hospitalization and ongoing recovery are:: Inpatient Psych   Choice offered to / list presented to : NA      Expected Discharge Plan and Services In-house Referral: Clinical Social Work     Living arrangements for the past 2 months: Apartment                                      Prior Living Arrangements/Services Living arrangements for the past 2 months: Apartment Lives with:: Self Patient language and need for interpreter reviewed:: Yes Do you feel safe going back to the place where you live?: Yes      Need for Family Participation in Patient Care: No (Comment) Care giver support system in place?: No (comment)   Criminal Activity/Legal Involvement Pertinent to Current Situation/Hospitalization: No - Comment as needed  Activities of Daily Living   ADL Screening (condition at time of admission) Independently performs ADLs?: Yes (appropriate for developmental age) Is the patient deaf or have difficulty hearing?: No Does the patient have  difficulty seeing, even when wearing glasses/contacts?: No Does the patient have difficulty concentrating, remembering, or making decisions?: No  Permission Sought/Granted Permission sought to share information with : Family Supports Permission granted to share information with : Yes, Verbal Permission Granted  Share Information with NAME: Holley Linen     Permission granted to share info w Relationship: Stepmother  Permission granted to share info w Contact Information: [phone]  Emotional Assessment Appearance:: Appears younger than stated age Attitude/Demeanor/Rapport: Engaged Affect (typically observed): Pleasant, Appropriate Orientation: : Oriented to Situation, Oriented to Self, Oriented to Place, Oriented to  Time Alcohol / Substance Use: Alcohol Use Psych Involvement: Yes (comment)  Admission diagnosis:  Alcohol withdrawal (HCC) [F10.939] Dehydration [E86.0] Acute alcoholic intoxication in alcoholism with complication (HCC) [F10.229] Urinary tract infection without hematuria, site unspecified [N39.0] Patient Active Problem List   Diagnosis Date Noted   Acute alcoholic intoxication in alcoholism with complication (HCC) 12/19/2023   Severe episode of recurrent major depressive disorder, without psychotic features (HCC) 12/19/2023   Alcohol withdrawal (HCC) 05/29/2016   Transaminitis 05/29/2016   Increased anion gap metabolic acidosis 05/29/2016   Leukocytosis 05/29/2016   PCP:  Claudene Pellet, MD Pharmacy:   [facility] [address], [place] - [address] AT [facility] [address] [place]  [address] Phone: [phone] Fax: [phone]  [id number] -  [place], [place] - [address] [place] [address] Phone: [phone] Fax: [phone]     Social Drivers of Health (SDOH) Social History: SDOH Screenings   Food Insecurity: No Food Insecurity (12/18/2023)  Housing: Low Risk   (12/18/2023)  Transportation Needs: No Transportation Needs (12/18/2023)  Utilities: Not At Risk (12/18/2023)  Tobacco Use: Medium Risk (12/17/2023)   SDOH Interventions:     Readmission Risk Interventions     No data to display

## 2023-12-20 NOTE — Consult Note (Signed)
 NEUROLOGY CONSULT NOTE   Date of service: December 20, 2023 Patient Name: Caitlin Brown MRN:  [id number] DOB:  1980/10/18 Chief Complaint: blurred vision and unsteady gait Requesting Provider: Judeth Trenda BIRCH, MD  History of Present Illness  Caitlin Brown is a 43 y.o. female with hx of etOh use, depression, hx of EtO withdrawal seizures who initially presented to [facility] ED via EMS for suicidal ideation. She was transferred to ED for concern for acute EtOh withdrawal. She was admitted to Hospitalist service and started on Ativan  per CIWA protocol along with thiamine  100mg  daily.  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  Patient is in the hospital for medical clearance with plan to admit to Behavioral health for inpatient psychiatric hospitalization once medically optimized and stable.  She endorses intermittent blurred vision that she attributes to her contact lenses.  She reports that sometimes and then a clean, she can get some intermittent blurred vision that when she cleans them up, the vision improves.  She feels that the blurred vision today seems typical of that.  She also endorses drinking about a bottle of wine or 2 a day along with some seltzers and sometimes she will not eat anything.  She cannot recall last time she had a meal at her home.   ROS  Comprehensive ROS performed and pertinent positives documented in HPI   Past History   Past Medical History:  Diagnosis Date   Depression     Past Surgical History:  Procedure Laterality Date   CESAREAN SECTION      Family History: History reviewed. No pertinent family history.  Social History  reports that she has quit smoking. She has never used  smokeless tobacco. She reports current alcohol use of about 28.0 standard drinks of alcohol per week. She reports that she does not use drugs.  No Known Allergies  Medications   Current Facility-Administered Medications:    enoxaparin  (LOVENOX ) injection 40 mg, 40 mg, Subcutaneous, Q24H, Georgina Basket, MD, 40 mg at 12/19/23 0841   feeding supplement (ENSURE PLUS HIGH PROTEIN) liquid 237 mL, 237 mL, Oral, BID BM, Georgina Basket, MD, 237 mL at 12/19/23 1400   folic acid  (FOLVITE ) tablet 1 mg, 1 mg, Oral, Daily, Tegeler, Lonni PARAS, MD, 1 mg at 12/20/23 0802   hydrALAZINE (APRESOLINE) injection 10 mg, 10 mg, Intravenous, Q6H PRN, Hongalgi, Anand D, MD, 10 mg at 12/20/23 0800   [EXPIRED] LORazepam  (ATIVAN ) injection 0-4 mg, 0-4 mg, Intravenous, Q6H, 4 mg at 12/19/23 1523 **FOLLOWED BY** LORazepam  (ATIVAN ) injection 0-4 mg, 0-4 mg, Intravenous, Q12H, Tegeler, Lonni PARAS, MD, 4 mg at 12/19/23 2045   LORazepam  (ATIVAN ) tablet 1-4 mg, 1-4 mg, Oral, Q1H PRN, 1 mg at 12/20/23 1112 **OR** LORazepam  (ATIVAN ) injection 1-4 mg, 1-4 mg, Intravenous, Q1H PRN, Tegeler, Lonni PARAS, MD, 2 mg at 12/18/23 0742   multivitamin with minerals tablet 1 tablet, 1 tablet, Oral, Daily, Tegeler, Lonni PARAS, MD, 1 tablet at 12/20/23 0801   sertraline  (ZOLOFT ) tablet 75 mg, 75 mg, Oral, QHS, Georgina Basket, MD, 75 mg at 12/19/23 2043   thiamine  (VITAMIN B1) 500 mg in sodium chloride  0.9 % 50 mL IVPB, 500 mg, Intravenous, Q8H, Last Rate: 110 mL/hr at 12/20/23 1322, 500 mg at 12/20/23 1322 **FOLLOWED BY** [  START ON 12/22/2023] thiamine  (VITAMIN B1) 250 mg in sodium chloride  0.9 % 50 mL IVPB, 250 mg, Intravenous, Daily **FOLLOWED BY** [START ON 12/28/2023] thiamine  (VITAMIN B1) injection 100 mg, 100 mg, Intravenous, Daily, Vanessa Robert, MD  Vitals   Vitals:   12/20/23 0517 12/20/23 0740 12/20/23 1046 12/20/23 1606  BP: (!) 134/106 (!) 136/106 133/89 121/87  Pulse: 88 77 (!) 120 (!) 108  Resp: 18 18 18 18   Temp:  97.9 F (36.6 C) 98 F (36.7 C)  98.5 F (36.9 C)  TempSrc:  Oral  Oral  SpO2: 99% 96% 98% 99%  Weight:      Height:        Body mass index is 25.18 kg/m.   Physical Exam   General: Laying comfortably in bed; in no acute distress.  HENT: Normal oropharynx and mucosa. Normal external appearance of ears and nose.  Neck: Supple, no pain or tenderness  CV: No JVD. No peripheral edema.  Pulmonary: Symmetric Chest rise. Normal respiratory effort.  Abdomen: Soft to touch, non-tender.  Ext: No cyanosis, edema, or deformity  Skin: No rash. Normal palpation of skin.   Musculoskeletal: Normal digits and nails by inspection. No clubbing.   Neurologic Examination  Mental status/Cognition: Alert, oriented to self, place, month and year, good attention.  Speech/language: Fluent, comprehension intact, object naming intact, repetition intact.  Cranial nerves:   CN II Pupils equal and reactive to light, no VF deficits    CN III,IV,VI EOM intact, no gaze preference or deviation, no nystagmus    CN V normal sensation in V1, V2, and V3 segments bilaterally    CN VII no asymmetry, no nasolabial fold flattening    CN VIII normal hearing to speech    CN IX & X normal palatal elevation, no uvular deviation    CN XI 5/5 head turn and 5/5 shoulder shrug bilaterally    CN XII midline tongue protrusion    Motor:  Muscle bulk: normal, tone normal, pronator drift none tremor none Mvmt Root Nerve  Muscle Right Left Comments  SA C5/6 Ax Deltoid 5 5   EF C5/6 Mc Biceps 5 5   EE C6/7/8 Rad Triceps 5 5   WF C6/7 Med FCR     WE C7/8 PIN ECU     F Ab C8/T1 U ADM/FDI 5 5   HF L1/2/3 Fem Illopsoas 5 5   KE L2/3/4 Fem Quad 5 5   DF L4/5 D Peron Tib Ant 5 5   PF S1/2 Tibial Grc/Sol 5 5    Sensation:  Light touch Intact throughout   Pin prick    Temperature    Vibration Decreased in BL toes and finger tips.  Proprioception    Coordination/Complex Motor:  - Finger to Nose intact BL - Heel to shin  with mild ataxia BL - Rapid alternating movement are intact BL - Gait: Stride length normal. Arm swing normal. Base width narrow. Able to toe walk and heel walk. Ataxia noted with tandem walk.  Labs/Imaging/Neurodiagnostic studies   CBC:  Recent Labs  Lab 08-Jan-2024 0813 12/20/23 0547  WBC 4.2 4.0  HGB 11.8* 13.3  HCT 36.0 38.6  MCV 95.5 91.9  PLT 176 174   Basic Metabolic Panel:  Lab Results  Component Value Date   NA 134 (L) 12/20/2023   K 3.5 12/20/2023   CO2 19 (L) 12/20/2023   GLUCOSE 82 12/20/2023   BUN 6 12/20/2023   CREATININE 0.69 12/20/2023   CALCIUM 8.8 (  L) 12/20/2023   GFRNONAA >60 12/20/2023   GFRAA >60 05/30/2016   Lipid Panel: No results found for: LDLCALC HgbA1c: No results found for: HGBA1C Urine Drug Screen:     Component Value Date/Time   LABOPIA NONE DETECTED 12/17/2023 2141   COCAINSCRNUR NONE DETECTED 12/17/2023 2141   LABBENZ NONE DETECTED 12/17/2023 2141   AMPHETMU NONE DETECTED 12/17/2023 2141   THCU POSITIVE (A) 12/17/2023 2141   LABBARB NONE DETECTED 12/17/2023 2141    Alcohol Level     Component Value Date/Time   ETH 365 (HH) 12/17/2023 2219   INR No results found for: INR APTT No results found for: APTT AED levels: No results found for: PHENYTOIN, ZONISAMIDE, LAMOTRIGINE, LEVETIRACETA  No imaging to review.  ASSESSMENT   Caitlin Brown is a 43 y.o. female with hx of etOh use, depression, hx of EtO withdrawal seizures who initially presented to [facility] ED via EMS for suicidal ideation. She was transferred to ED for concern for acute EtOh withdrawal. She was admitted to Hospitalist service and started on Ativan  per CIWA protocol along with thiamine  100mg  daily. Endorses poor Po intake, skips meals and can't recall last time she ate something before she came to the hospital.  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  RECOMMENDATIONS  - B12, folate, B6, MMA, homocysteine levels. - she is getting Thiamine  daily so unfortunately, levels are not going to be accurate. - Follow up with neurology outpatient with outpatient EMG/NCS. - recommend PT and OT. - continue CIWA protocol ______________________________________________________________________  Plan discussed with patient at the bedside. Plan also discussed with Dr. Judeth over secure chat and with RN at the nursing desk.   Signed, Edina Winningham, MD [facility]

## 2023-12-20 NOTE — Discharge Instructions (Signed)
 In a time of Crisis: [facility]  Mobile Crisis Management provides immediate crisis response, 24/7.  Call [phone]  [facility] for MH/DD/[facility] is available 24 hours a day, 7 days a week. Customer Service Specialists will assist you to find a crisis provider that is well-matched with your needs. Your local number is: [phone]  [facility] Urgent [facility] ([facility]) IOP, individual counseling, medication management [phone] [place], [place] [address] [phone] Call for intake hours; Medicaid and Uninsured    Substance Use Outpatient Providers  [facility] ([facility]) Group and individual counseling. [address]  [place], [place] [address] [phone] [place]: [phone]  [place]: [phone] Medicaid and uninsured.   [facility] Offers IOP groups multiple times per week. [address] Sherian Maroon [place], [place] [address] [phone] Takes Medicaid and other insurances.   [facility]  Chemical Dependency Intensive Outpatient Program (IOP) [address] [place], [place] [address] [phone] Takes Nurse, learning disability and [place].   [facility]  IOP and Partial Hospitalization Program  [address]  [place], [place] [address] [phone] Private Insurance, [place] only for partial hospitalization  [facility] [address], [address], [place], [place] [address] [phone] Monday-Friday. Short and Long term options.  [facility] Urgent Care (BHUC) IOP, individual counseling, medication management [address] [place], [place] [address] [phone] Medicaid and [facility]  [facility] [address]  [place], [place] [address] [phone] Private Insurance and Self Pay   [facility] Outpatient [address]  [place], [place] [address] [phone] Private Insurance, [place], and Self Pay   [facility]  [address] [place], [place] [address] [facility]  [address]  [place], [place] [address] [phone]  [facility]  [address] [place], [place] [address] [phone]      Residential Treatment Programs  [facility] [address] [place], [place] [address] [phone] or [phone] Detox and Residential Rehab 21 days (Medicaid, private insurance, and self pay. If Medicare, will look into funding). No methadone. Call for pre-screen.   [facility]) [address]  [place], [place] [address] [phone] Detox 3-7 days (self Pay and Medicaid Limited availability). Transitional Program for females needs 60 days clean first.  Rehab Only for Males (Medicare, Medicaid, and Self Pay)-No methadone.  [facility] [address] [place], [place] [address] [phone] or [phone] Private Insurance only  [facility] PHONE: [phone] FAX: [phone] Residential program for women 21 and over for up to a year through a Christian 12-step recovery model. Self-pay.    [facility] [address] [place], [place] [address] Phone:  [phone] Must be detoxed 72 hours prior to admission; 28 day program.  Self-pay.  [facility] [address]  [place], [place] [phone] [employer], Medicare, [place] (not straight [place]). They offer assistance with transportation.   [facility] [address] [place],  [place], [place] [address] [phone] Christian Based Program. Men only. No insurance  [employer] is a substance use disorder treatment program for women, including those who are pregnant, parenting, and/or whose lives have been touched by abuse and violence. (800)  [phone]  [facility]  [address]  [place], [place] [address] Women's: [phone] Men's: [phone] No Medicaid.   [facility] [place] Locations across the [place] (mainly [place]) willing to help with transportation.  [phone] [employer] [facility]  [address] [place]  [place], [place] [address] [phone] Treatment Only, must make assessment appointment, and must be sober for assessment appointment. Self pay, [facility], must be [facility] resident. No methadone.   [facility]  [address] [place], [place] [address] [phone] No pending legal charges, Long-term work program. No methadone. Call for assessment.  [facility]  [address], [place], [place] [address] [phone] or [phone] Commercial Insurance Only  [facility] - [phone] Private Insurance (no [place]). Males/Females, call to make referrals, multiple facilities.   [facility]: [facility] [address] [place], [place] [address] [phone]  [facility]-no methadone: [facility] [address] [place], [place] [address] [phone] Lacy Duverney [facility] [phone] [place], [place] For women, houses 8 residents for sober living. No Medicaid.         AA Meetings Website to locate meetings (virtually or in person): [email or web address].[email or web address].biz/ Phone: [phone]  Syringe Services Program: Due to COVID-19, syringe services programs are likely operating under different hours with limited or no fixed site hours. Some programs may not be operating at all. Please contact the program directly using the phone numbers provided below to see if they are still operating under COVID-19. [facility] Solution to the Opioid Problem (GCSTOP) Fixed;  mobile; peer-based;Bobbye Riggs) [phone] [email or web address] Fixed site exchange at [facility], [address] [place], [place] [address] on Wednesdays (2:00 - 5:00 pm) and Thursdays (4:00 - 8:00 pm). Pop-up mobile exchange locations: [employer], [address] [place], [place] [address] on Tuesdays (11:00 am - 1:00 pm) and Fridays (11:00 am - 1:00 pm) -[facility] - [address], [place], [place] [address] on Tuesdays (2:00 - 4:00 pm) and Fridays (2:00 - 4:00 pm) -[employer] copy - also serves Radio broadcast assistant and [employer] [place] [employer];Fixed; mobile; peer-based; Lendon Ka [phone] [email or web address] [address] [place], [place] [address] Delivery and outreach available in [place] and [place], please call for more information. Monday, Tuesday: 1:00 -7:00 pm, Thursday: 4:00 pm - 8:00 pm, Friday: 1:00 pm - 8:00 pm)  Medication-Assisted Treatment (MAT):  -[facility]- services [address] and surrounding areas including [place], [place], [place], [place], [address], [place], [place], [place], [place], and [place], [place]. Options include Methadone, buprenorphine or Suboxone. [address], [place], [place] [address] Phone: [phone] Mon - Fri: 5:30am - 2:00pm Sat: 5:30am -7:30am Sun: Closed Holidays: 6:00am - 8:00am  -[facility]- We use FDA-approved medications, like methadone/suboxone/sublocade, and vivitrol. These medications are then combined with customized care plans that include individual or group counseling, toxicology, and medical care directed by on-site physicians. Accepts most insurance plans, Medicaid, and private pay.  [address] [place], [place] [address] Phone: [phone] Monday-Friday 5:00 AM - 10:00 AM Saturday 6:00 AM - 8:30 AM Sunday 6:00 AM - 7:00 AM  -[facility]- [facility] is a treatment & recovery focused  program. In addition to receiving methadone medication, our clients participate in individual and group counseling as well as random drug testing. If accepted into the [facility] Opioid Program, you will be provided several intake appointments and a physical exam  [address], [place] [address] Office: [phone]  Fax: [phone]  -[facility]- We put our community members at the center of everything we do, for remote treatment services as well as in-person, from alcohol withdrawal to opioid use and more.  [address], [place], [place] [address] [phone] Monday-Wednesday: 9:00am - 5:00pm Thursday: 9:00am - 6:00pm Friday: 9:00am - 5:00pm Saturday: 9:00am - 1:00pm Sunday: Closed  -[facility] [place]) [address] [place], [place] [address] [phone]  [place] [phone] [address] [place], [place] [address]  M-W    5:00am-12:00pm Thu     5:00am-10:00am Fri       5:00am-12:00pm Sat      5:00am-8:00am Sun     Closed  $12/daily for Methadone Treatment.

## 2023-12-20 NOTE — Consult Note (Addendum)
  Psychiatric Consult Follow-up  Patient Name: .Caitlin Brown  MRN: [id number]  DOB: 09-01-1980  Consult Order details:  Orders (From admission, onward)     Start     Ordered   12/18/23 1228  IP CONSULT TO PSYCHIATRY       Ordering Provider: Georgina Basket, MD  Provider:  (Not yet assigned)  Question Answer Comment  Location [facility]   Reason for Consult? SI      12/18/23 1227             Mode of Visit: In person   Psychiatry Consult Evaluation  Service Date: December 20, 2023 LOS:  LOS: 2 days  Chief Complaint Suicidal ideations  Primary Psychiatric Diagnoses  MDD severe 2.  Moderate alcohol use disorder  Assessment  Caitlin Brown is a 43 y.o. female admitted: Medicallyfor 12/17/2023  8:14 PM for alcohol use disorder and complications. She carries the psychiatric diagnoses of MDD, GAD and AIUD and has a past medical history of  seizures.   Her current presentation of tearfulness, low mood, hopelessness, guilt, and increased alcohol use is most consistent with a Major Depressive Episode in the context of Alcohol Use Disorder, severe. She meets criteria for Major Depressive Disorder, recurrent, moderate to severe, based on persistent depressive symptoms lasting more than two weeks, anhedonia, insomnia, decreased appetite, feelings of worthlessness and guilt, and recurrent thoughts of death that intensify during alcohol use.  Current outpatient psychotropic medication includes sertraline  (Zoloft ), prescribed by her primary care provider, and historically she has had a partial but generally positive response to this medication. She was partially compliant with her medication prior to admission, as evidenced by self-reported adherence with intermittent missed doses during periods of alcohol relapse.  On initial examination, the patient appeared alert, oriented, cooperative, and tearful with depressed mood and congruent affect. Thought process was  linear and goal-directed with no evidence of psychosis. She denied current suicidal ideation, though acknowledged passive fleeting thoughts in the past when intoxicated. Insight and judgment were limited regarding alcohol use.  The patient presented with suicidal thoughts in the context of heavy alcohol use and withdrawal symptoms. She reported long-standing depression with worsening mood, poor coping, and increased alcohol intake over the past several months. Her alcohol consumption pattern includes binge drinking on weekends and maintenance drinking during weekdays to prevent withdrawal. Despite recent suicidal ideation, she denied current intent or plan once sober and medically stabilized. Her presentation is consistent with alcohol-induced mood disturbance superimposed on recurrent major depressive disorder.  Patient would benefit from inpatient psychiatric hospitalization once medically cleared to work on coping skills and therapy. She is agreeable to this, and also willing to pursue substance use treatment following inpatient psychiatric hospitalization.   Please see plan below for detailed recommendations.  Diagnoses:  Active Hospital problems: Principal Problem:   Alcohol withdrawal (HCC) Active Problems:   Acute alcoholic intoxication in alcoholism with complication (HCC)   Severe episode of recurrent major depressive disorder, without psychotic features (HCC)   Plan   ## Psychiatric Medication Recommendations:  -Continue Zoloft  75 mg daily -Continue CIWA protocol -Continue Alcohol detox protocol  ## Medical Decision Making Capacity: Not specifically addressed in this encounter  ## Further Work-up:  -- B12, folate and TSH TSH, B12, folate, EKG, While pt on Qtc prolonging medications, please monitor & replete K+ to 4 and Mg2+ to 2, TOC consult for substance abuse resources, U/A, or UDS -- most recent EKG on 10/17 had QTc of  429 -- Pertinent labwork reviewed earlier this admission  includes: BAL 365, Lactic 2.3  ## Disposition:-- We recommend inpatient psychiatric hospitalization when medically cleared. Patient is under voluntary admission status at this time; please IVC if attempts to leave hospital. She has consented to go voluntary for treatment.   ## Behavioral / Environmental: - No specific recommendations at this time.    ## Safety and Observation Level:  - Based on my clinical evaluation, I estimate the patient to be at moderate risk of self harm in the current setting. - At this time, we recommend  1:1 Observation. This decision is based on my review of the chart including patient's history and current presentation, interview of the patient, mental status examination, and consideration of suicide risk including evaluating suicidal ideation, plan, intent, suicidal or self-harm behaviors, risk factors, and protective factors. This judgment is based on our ability to directly address suicide risk, implement suicide prevention strategies, and develop a safety plan while the patient is in the clinical setting. Please contact our team if there is a concern that risk level has changed.  CSSR Risk Category:C-SSRS RISK CATEGORY: High Risk  Suicide Risk Assessment: Patient has following modifiable risk factors for suicide: untreated depression, under treated depression , social isolation, recklessness, active mental illness (to encompass adhd, tbi, mania, psychosis, trauma reaction), current symptoms: anxiety/panic, insomnia, impulsivity, anhedonia, hopelessness, and triggering events, which we are addressing by inpatient psych and medication management. Patient has following non-modifiable or demographic risk factors for suicide: separation or divorce and history of self harm behavior Patient has the following protective factors against suicide: Access to outpatient mental health care, Supportive family, Supportive friends, Cultural, spiritual, or religious beliefs that  discourage suicide, Pets in the home, Frustration tolerance, no history of suicide attempts, and no history of NSSIB  Thank you for this consult request. Recommendations have been communicated to the primary team.  We will continue to follow at this time.   Alfornia Light, DO      History of Present Illness  Relevant Aspects of [facility] Course:  Admitted on 12/17/2023 with a history of major depressive disorder, alcohol use disorder, prior alcohol withdrawal, and seizure history who initially presented to [facility] with suicidal ideation and was transferred to the Emergency Department for evaluation and management of possible alcohol withdrawal. On arrival, she was tachycardic and started on a CIWA protocol for alcohol withdrawal management. Laboratory work revealed ethanol level of 365 mg/dL, urinalysis with pyuria and bacteriuria, and lactate of 2.3 decreasing to 1.6. Chest x-ray was negative for acute process. She received IV ceftriaxone and 1 liter of IV fluids prior to admission to the medical floor. There were no fevers or leukocytosis noted.  Patient Report:  Patient reports drinking two large bottles of wine daily, sometimes supplemented with seltzers if she remains awake. She describes her weekday pattern as consuming up to a twelve-pack of seltzers to "keep from shaking" and heavier drinking on weekends. She acknowledges feeling depressed, hopeless, and frustrated about her alcohol use and its consequences, including strained family relationships and recurrent hospitalizations. She denies current suicidal ideation or intent but admits to fleeting suicidal thoughts when intoxicated. She reports fatigue, insomnia, decreased appetite, and guilt over her inability to remain abstinent. She states she has experienced similar episodes in the past and typically seeks help before her symptoms escalate. Currently, she expresses motivation to stabilize medically and work toward  recovery but notes limited social supports and difficulty maintaining sobriety.  12/20/2023: Patient was seen in room resting in bed watching TV.  She says that she slept very well last night, better than she has in a long time.  She says she is not very hungry and had pickles for lunch.  She denies hallucinations for the past 2 days.  She said previously she thought there is stuff coming out of the walls and she had been seeing demons, but this is now resolved.  We had brief discussion about her alcohol use disorder and how she has been using this to deal with her shame and guilt surrounding her previous affair and divorce.  Patient said that she felt so much shame with her affair, that she did not take anything (monetary or property) from the divorce, and has been dealing with heightened anxiety due to income differences between herself and her ex-husband.  She endorses anxiety around her children growing up, and then having preference to stay with dad because he has the bigger house.  We discussed her need for inpatient psychiatric hospitalization, and patient was initially somewhat hesitant about this.  She says that she had been denying SI, and would never kill herself because she loves her children.  We then discussed the reason for inpatient psychiatric hospitalization was not only for her SI, but for patient to orient better coping skills and being able to handle and deal with stressors, and her intense shame and guilt without relying on alcohol.  She then felt more comfortable with psychiatric hospitalization, and was agreeable to this.  Psych ROS:  Depression: Yes worthlessness, guilty, insomnia, sadness, hopeless, suicidal ideation passive, tearfulness Anxiety:  Yes excessive worry Mania (lifetime and current): Denies Psychosis: (lifetime and current): denies  Collateral information:  Patient needed phone to get phone numbers for collateral.   ROS Psychiatric and Social History   Psychiatric History:  Information collected from Patient and chart review  Prev Dx/Sx: Depression and anxiety Current Psych Provider: None Home Meds (current): Zoloft  Previous Med Trials: Zoloft  Therapy: Denies  Prior Psych Hospitalization: Denies  Prior Self Harm: Denies Prior Violence: Denies  Family Psych History: Denies Family Hx suicide: Denies  Social History:  Developmental Hx: WNL Educational Hx: Bachelors Occupational Hx: Careers information officer Hx: 2 DWI. None currently Living Situation: Lives alone in an apartment Spiritual Hx: Catholic Access to weapons/lethal means: Denies   Substance History Alcohol: Yes Type of alcohol WIne and seltzers Last Drink Friday Number of drinks per day Binge drinking (2) 1.75 L bottles History of alcohol withdrawal seizures Denies History of DT's Denies Tobacco: Denies Illicit drugs: Denies Prescription drug abuse: Denies Rehab hx: 2018 inpatient residential  Exam Findings  Physical Exam: Age appropriate caucasian female, lying in bed in fetal position.  Vital Signs:  Temp:  [97.9 F (36.6 C)-98.3 F (36.8 C)] 98 F (36.7 C) (10/20 0740) Pulse Rate:  [77-100] 77 (10/20 0740) Resp:  [18-20] 18 (10/20 0740) BP: (134-159)/(90-114) 136/106 (10/20 0740) SpO2:  [96 %-99 %] 96 % (10/20 0740) Blood pressure (!) 136/106, pulse 77, temperature 98 F (36.7 C), temperature source Oral, resp. rate 18, height 5' 6 (1.676 m), weight 70.8 kg, last menstrual period 12/14/2023, SpO2 96%. Body mass index is 25.18 kg/m.  Physical Exam Mental Status Exam: General Appearance: Casual and Fairly Groomed  Orientation:  Full (Time, Place, and Person)  Memory:  Immediate;   Good Recent;   Good  Concentration:  Concentration: Fair and Attention Span: Good  Recall:  Good  Attention  Fair  Eye Contact:  Good  Speech:  Clear and Coherent and Slow  Language:  Fair  Volume:  Decreased  Mood: sad  Affect:  Appropriate and Congruent   Thought Process:  Coherent, Goal Directed, Linear, and Descriptions of Associations: Intact  Thought Content:  Logical  Suicidal Thoughts:  No  Homicidal Thoughts:  No  Judgement:  Fair  Insight:  Fair  Psychomotor Activity:  Normal  Akathisia:  No  Fund of Knowledge:  Fair      Assets:  Manufacturing systems engineer Desire for Improvement Financial Resources/Insurance Housing Intimacy Leisure Time Physical Health Resilience Social Support Talents/Skills Transportation Vocational/Educational  Cognition:  WNL  ADL's:  Intact  AIMS (if indicated):        Other History   These have been pulled in through the EMR, reviewed, and updated if appropriate.  Family History:  The patient's family history is not on file.  Medical History: Past Medical History:  Diagnosis Date   Depression    Surgical History: Past Surgical History:  Procedure Laterality Date   CESAREAN SECTION     Medications:   Current Facility-Administered Medications:    enoxaparin  (LOVENOX ) injection 40 mg, 40 mg, Subcutaneous, Q24H, Georgina Basket, MD, 40 mg at 12/19/23 0841   feeding supplement (ENSURE PLUS HIGH PROTEIN) liquid 237 mL, 237 mL, Oral, BID BM, Georgina Basket, MD, 237 mL at 12/19/23 1400   folic acid  (FOLVITE ) tablet 1 mg, 1 mg, Oral, Daily, Tegeler, Lonni PARAS, MD, 1 mg at 12/20/23 0802   hydrALAZINE (APRESOLINE) injection 10 mg, 10 mg, Intravenous, Q6H PRN, Hongalgi, Anand D, MD, 10 mg at 12/20/23 0800   [EXPIRED] LORazepam  (ATIVAN ) injection 0-4 mg, 0-4 mg, Intravenous, Q6H, 4 mg at 12/19/23 1523 **FOLLOWED BY** LORazepam  (ATIVAN ) injection 0-4 mg, 0-4 mg, Intravenous, Q12H, Tegeler, Lonni PARAS, MD, 4 mg at 12/19/23 2045   LORazepam  (ATIVAN ) tablet 1-4 mg, 1-4 mg, Oral, Q1H PRN, 1 mg at 12/19/23 1013 **OR** LORazepam  (ATIVAN ) injection 1-4 mg, 1-4 mg, Intravenous, Q1H PRN, Tegeler, Lonni PARAS, MD, 2 mg at 12/18/23 9257   multivitamin with minerals tablet 1 tablet, 1 tablet, Oral, Daily,  Tegeler, Lonni PARAS, MD, 1 tablet at 12/20/23 0801   sertraline  (ZOLOFT ) tablet 75 mg, 75 mg, Oral, QHS, Georgina Basket, MD, 75 mg at 12/19/23 2043   thiamine  (VITAMIN B1) tablet 100 mg, 100 mg, Oral, Daily, 100 mg at 12/20/23 0802 **OR** [DISCONTINUED] thiamine  (VITAMIN B1) injection 100 mg, 100 mg, Intravenous, Daily, Tegeler, Lonni PARAS, MD, 100 mg at 12/19/23 0847  Allergies: No Known Allergies  Danny Yackley, DO

## 2023-12-20 NOTE — Progress Notes (Signed)
 PROGRESS NOTE   Caitlin Brown  FMW:[id number]    DOB: January 09, 1981    DOA: 12/17/2023  PCP: Claudene Pellet, MD   I have briefly reviewed patients previous medical records in [facility].   Brief Hospital Course:  43 year old female with medical history significant for depression, alcohol use disorder, previous alcohol withdrawal and related seizures, who went to behavioral urgent care for evaluation of suicidal ideation and was sent to the ED due to concern for alcohol withdrawal.  In ED, tachycardic.  Blood alcohol level 65.  Admitted for alcohol intoxication, alcohol withdrawal, suicidal ideations.  Improving.  Psychiatry consulted and plan inpatient behavioral health admission after medically cleared.  As per psychiatry, patient has agreed for voluntary admission at this time but if she attempts to leave the hospital, she will need to be involuntarily committed.    10/20: Reporting blurred vision, unsteady gait.  Neurology and PT consulted.  Not yet medically ready for DC to [facility]   Assessment & Plan:   Alcohol use disorder Alcohol intoxication Alcohol withdrawal History of withdrawal seizures Mild transaminitis. Presented to the ED with tachycardia up to 138, hypertension to 135/99.  Elevated lactate/2.3. Blood alcohol level: 365. Ongoing high scores, 14 last night, up to 5 this morning. Continues to be at risk for continued withdrawal. Continue CIWA protocol. 10/20: Patient reported blurred vision, unsteady gait.  Requested PT and Neurology evaluation.  Concern for Wernicke's encephalopathy.  Communicated with Dr.Khaliqdana, Neurology who will evaluate her and has started her on high-dose IV thiamine .  Major depressive disorder, severe Suicidal ideations Psychiatry consultation 10/19 appreciated.  Continue Zoloft , CIWA protocol and they recommend inpatient psychiatric hospitalization when medically cleared. Continue 1: 1 Recruitment consultant.  Suicide precautions.  E. coli acute  cystitis Pansensitive E. coli on urine culture.  Has completed 3 days of IV ceftriaxone, completed course, discontinued ceftriaxone.  Body mass index is 25.18 kg/m.   DVT prophylaxis: enoxaparin  (LOVENOX ) injection 40 mg Start: 12/18/23 0815     Code Status: Full Code:  Family Communication: None at bedside. Disposition:  Status is: Inpatient Remains inpatient appropriate because: Ongoing alcohol withdrawal, remains on CIWA protocol with close monitoring.  New complaints as noted above, awaiting neurology consultation.     Consultants:   Psychiatry Neurology  Procedures:     Subjective:  Patient states that she recently had her eyes checked and got updated contact lenses.  She has intermittent blurred vision even prior but now reports blurred vision in both eyes, frontal headache, unsteady gait.  Denies double vision.  Also reports that she continues to feel sad and crying.  Objective:   Vitals:   12/19/23 2107 12/20/23 0517 12/20/23 0740 12/20/23 1046  BP: (!) 159/114 (!) 134/106 (!) 136/106 133/89  Pulse: 100 88 77 (!) 120  Resp: 20 18 18 18   Temp: 98 F (36.7 C) 97.9 F (36.6 C) 98 F (36.7 C)   TempSrc: Oral  Oral   SpO2: 98% 99% 96% 98%  Weight:      Height:        General exam: Young female, moderately built and nourished sitting up in bed.  Although smiles a little, started to cry soon after.  Safety sitter just sitting outside her room. Respiratory system: Clear to auscultation.  No increased work of breathing. Cardiovascular system: S1 & S2 heard, RRR. No JVD, murmurs, rubs, gallops or clicks. No pedal edema.  Telemetry personally reviewed: Sinus rhythm.  Occasional and transient sinus tachycardia in the 120s. Gastrointestinal  system: Abdomen is nondistended, soft and nontender. No organomegaly or masses felt. Normal bowel sounds heard. Central nervous system: Alert and oriented. PERTLA.  No cranial nerve deficits.  No nystagmus or diplopia.  No focal  neurological deficits. Extremities: Symmetric 5 x 5 power. Skin: No rashes, lesions or ulcers Psychiatry: Judgement and insight appear normal. Mood & affect tearful.    Data Reviewed:   I have personally reviewed following labs and imaging studies   CBC: Recent Labs  Lab 12/17/23 2219 12/18/23 0813 12/20/23 0547  WBC 4.7 4.2 4.0  HGB 12.8 11.8* 13.3  HCT 38.5 36.0 38.6  MCV 94.6 95.5 91.9  PLT 234 176 174    Basic Metabolic Panel: Recent Labs  Lab 12/17/23 2219 12/18/23 0813 12/19/23 0440 12/20/23 0547  NA 144  --  136 134*  K 4.0  --  3.6 3.5  CL 105  --  103 103  CO2 23  --  20* 19*  GLUCOSE 109*  --  86 82  BUN 8  --  <5* 6  CREATININE 0.70 0.64 0.71 0.69  CALCIUM 8.3*  --  8.2* 8.8*  MG 1.9  --   --  1.8  PHOS 3.7  --   --   --     Liver Function Tests: Recent Labs  Lab 12/17/23 2219 12/20/23 0547  AST 55* 76*  ALT 43 65*  ALKPHOS 57 51  BILITOT 0.3 0.8  PROT 7.4 7.4  ALBUMIN 4.1 4.0    CBG: Recent Labs  Lab 12/19/23 2102  GLUCAP 99    Microbiology Studies:   Recent Results (from the past 240 hours)  Urine Culture     Status: Abnormal   Collection Time: 12/17/23  9:41 PM   Specimen: Urine, Random  Result Value Ref Range Status   Specimen Description URINE, RANDOM  Final   Special Requests   Final    NONE Reflexed from [id number] Performed at [facility], [address], [place], [place] [address]    Culture >=100,000 COLONIES/mL ESCHERICHIA COLI (A)  Final   Report Status 12/19/2023 FINAL  Final   Organism ID, Bacteria ESCHERICHIA COLI (A)  Final      Susceptibility   Escherichia coli - MIC*    AMPICILLIN 4 SENSITIVE Sensitive     CEFAZOLIN (URINE) Value in next row Sensitive      <=1 SENSITIVEThis is a modified FDA-approved test that has been validated and its performance characteristics determined by the reporting laboratory.  This laboratory is certified under the Clinical Laboratory Improvement Amendments CLIA as qualified  to perform high complexity clinical laboratory testing.    CEFEPIME Value in next row Sensitive      <=1 SENSITIVEThis is a modified FDA-approved test that has been validated and its performance characteristics determined by the reporting laboratory.  This laboratory is certified under the Clinical Laboratory Improvement Amendments CLIA as qualified to perform high complexity clinical laboratory testing.    ERTAPENEM Value in next row Sensitive      <=1 SENSITIVEThis is a modified FDA-approved test that has been validated and its performance characteristics determined by the reporting laboratory.  This laboratory is certified under the Clinical Laboratory Improvement Amendments CLIA as qualified to perform high complexity clinical laboratory testing.    CEFTRIAXONE Value in next row Sensitive      <=1 SENSITIVEThis is a modified FDA-approved test that has been validated and its performance characteristics determined by the reporting laboratory.  This laboratory is certified  under the Clinical Laboratory Improvement Amendments CLIA as qualified to perform high complexity clinical laboratory testing.    CIPROFLOXACIN Value in next row Sensitive      <=1 SENSITIVEThis is a modified FDA-approved test that has been validated and its performance characteristics determined by the reporting laboratory.  This laboratory is certified under the Clinical Laboratory Improvement Amendments CLIA as qualified to perform high complexity clinical laboratory testing.    GENTAMICIN Value in next row Sensitive      <=1 SENSITIVEThis is a modified FDA-approved test that has been validated and its performance characteristics determined by the reporting laboratory.  This laboratory is certified under the Clinical Laboratory Improvement Amendments CLIA as qualified to perform high complexity clinical laboratory testing.    NITROFURANTOIN Value in next row Sensitive      <=1 SENSITIVEThis is a modified FDA-approved test that has  been validated and its performance characteristics determined by the reporting laboratory.  This laboratory is certified under the Clinical Laboratory Improvement Amendments CLIA as qualified to perform high complexity clinical laboratory testing.    TRIMETH/SULFA Value in next row Sensitive      <=1 SENSITIVEThis is a modified FDA-approved test that has been validated and its performance characteristics determined by the reporting laboratory.  This laboratory is certified under the Clinical Laboratory Improvement Amendments CLIA as qualified to perform high complexity clinical laboratory testing.    AMPICILLIN/SULBACTAM Value in next row Sensitive      <=1 SENSITIVEThis is a modified FDA-approved test that has been validated and its performance characteristics determined by the reporting laboratory.  This laboratory is certified under the Clinical Laboratory Improvement Amendments CLIA as qualified to perform high complexity clinical laboratory testing.    PIP/TAZO Value in next row Sensitive      <=4 SENSITIVEThis is a modified FDA-approved test that has been validated and its performance characteristics determined by the reporting laboratory.  This laboratory is certified under the Clinical Laboratory Improvement Amendments CLIA as qualified to perform high complexity clinical laboratory testing.    MEROPENEM Value in next row Sensitive      <=4 SENSITIVEThis is a modified FDA-approved test that has been validated and its performance characteristics determined by the reporting laboratory.  This laboratory is certified under the Clinical Laboratory Improvement Amendments CLIA as qualified to perform high complexity clinical laboratory testing.    * >=100,000 COLONIES/mL ESCHERICHIA COLI  Resp panel by RT-PCR (RSV, Flu A&B, Covid) Urine, Clean Catch     Status: None   Collection Time: 12/17/23  9:49 PM   Specimen: Urine, Clean Catch; Nasal Swab  Result Value Ref Range Status   SARS Coronavirus 2 by RT  PCR NEGATIVE NEGATIVE Final   Influenza A by PCR NEGATIVE NEGATIVE Final   Influenza B by PCR NEGATIVE NEGATIVE Final    Comment: (NOTE) The Xpert Xpress SARS-CoV-2/FLU/RSV plus assay is intended as an aid in the diagnosis of influenza from Nasopharyngeal swab specimens and should not be used as a sole basis for treatment. Nasal washings and aspirates are unacceptable for Xpert Xpress SARS-CoV-2/FLU/RSV testing.  Fact Sheet for Patients: BloggerCourse.com  Fact Sheet for Healthcare Providers: SeriousBroker.it  This test is not yet approved or cleared by the [place]  FDA and has been authorized for detection and/or diagnosis of SARS-CoV-2 by FDA under an Emergency Use Authorization (EUA). This EUA will remain in effect (meaning this test can be used) for the duration of the COVID-19 declaration under Section 564(b)(1) of the Act, 21 U.S.C. section  360bbb-3(b)(1), unless the authorization is terminated or revoked.     Resp Syncytial Virus by PCR NEGATIVE NEGATIVE Final    Comment: (NOTE) Fact Sheet for Patients: BloggerCourse.com  Fact Sheet for Healthcare Providers: SeriousBroker.it  This test is not yet approved or cleared by the [place]  FDA and has been authorized for detection and/or diagnosis of SARS-CoV-2 by FDA under an Emergency Use Authorization (EUA). This EUA will remain in effect (meaning this test can be used) for the duration of the COVID-19 declaration under Section 564(b)(1) of the Act, 21 U.S.C. section 360bbb-3(b)(1), unless the authorization is terminated or revoked.  Performed at [facility], [address], [place], [place] [address]     Radiology Studies:  No results found.   Scheduled Meds:    enoxaparin  (LOVENOX ) injection  40 mg Subcutaneous Q24H   feeding supplement  237 mL Oral BID BM   folic acid   1 mg Oral Daily    LORazepam   0-4 mg Intravenous Q12H   multivitamin with minerals  1 tablet Oral Daily   sertraline   75 mg Oral QHS   [START ON 12/28/2023] thiamine  (VITAMIN B1) injection  100 mg Intravenous Daily    Continuous Infusions:    thiamine  (VITAMIN B1) injection 500 mg (12/20/23 1322)   Followed by   NOREEN ON 12/22/2023] thiamine  (VITAMIN B1) injection       LOS: 2 days     Trenda Mar, MD,  FACP, [facility], [facility], [facility]   [facility] [place]      To contact the attending provider between 7A-7P or the covering provider during after hours 7P-7A, please log into the web site [email or web address] and access using universal [place] password for that web site. If you do not have the password, please call the hospital operator.  12/20/2023, 1:26 PM

## 2023-12-20 NOTE — TOC CAGE-AID Note (Signed)
 Transition of Care [facility]) - CAGE-AID Screening   Patient Details  Name: Caitlin Brown MRN: [id number] Date of Birth: May 14, 1980  [facility] of [facility]) CM/SW Contact:    Lendia Dais, LCSWA Phone Number: 12/20/2023, 4:34 PM   Clinical Narrative: CSW spoke to pt at bedside and explained wha ta CAFE AID is and the reason for the assessment. CSW asked permission to perform assessment and patient was agreeable. D/t answer 2 or more questions with yes CSW offered resources and pt was agreeable. Resources in AVS.    CAGE-AID Screening:    Have You Ever Felt You Ought to Cut Down on Your Drinking or Drug Use?: Yes Have People Annoyed You By Critizing Your Drinking Or Drug Use?: Yes Have You Felt Bad Or Guilty About Your Drinking Or Drug Use?: Yes Have You Ever Had a Drink or Used Drugs First Thing In The Morning to Steady Your Nerves or to Get Rid of a Hangover?: Yes CAGE-AID Score: 4  Substance Abuse Education Offered: Yes  Substance abuse interventions: Other (must comment) (Resources in AVS)

## 2023-12-20 NOTE — Evaluation (Signed)
 Physical Therapy Evaluation Patient Details Name: Caitlin Brown MRN: [id number] DOB: [birth year] Today's Date: 12/20/2023  History of Present Illness  Pt is 43 yo presenting to [facility] on 10/17 due to suicidal ideation, alcohol withdrawal with UTI and dehydration. PMH: depression, alcohol abuse, previous alcohol withdrawal and seizures.  Clinical Impression  Pt is presenting at Mod I for bed mobility, CGA for sit to stand and gait with intermittent HHA. Pt gait improved with distance. Pt was able to perform stairs at [facility] for safety with use of rail. Pt expected to progress well. Due to pt current functional status, home set up and available assistance at home no recommended skilled physical therapy services at this time on discharge from acute care hospital setting. Will continue to follow in acute setting in order to ensure that pt returns home with decreased risk for falls, injury, re-hospitalization and improved activity tolerance.          If plan is discharge home, recommend the following: Other (comment) (as needed)     Equipment Recommendations None recommended by PT     Functional Status Assessment Patient has had a recent decline in their functional status and demonstrates the ability to make significant improvements in function in a reasonable and predictable amount of time.     Precautions / Restrictions Precautions Precautions: Fall Recall of Precautions/Restrictions: Intact Restrictions Weight Bearing Restrictions Per Provider Order: No      Mobility  Bed Mobility Overal bed mobility: Modified Independent             General bed mobility comments: HOB elevated minor use of bed rails.    Transfers Overall transfer level: Needs assistance Equipment used: None Transfers: Sit to/from Stand Sit to Stand: Contact guard assist           General transfer comment: CGA for safety, wide BOS, ataxic movements.    Ambulation/Gait Ambulation/Gait  assistance: Contact guard assist Gait Distance (Feet): 200 Feet Assistive device: None, 1 person hand held assist Gait Pattern/deviations: Step-through pattern, Decreased step length - right, Decreased step length - left, Ataxic, Wide base of support Gait velocity: decreased Gait velocity interpretation: <1.8 ft/sec, indicate of risk for recurrent falls   General Gait Details: wide BOS, uneven steps with reciprocal gait pattern and minimal heel toe gait pattern. Improved with stability with increased gait. HHA provided intermittently  Stairs Stairs: Yes Stairs assistance: Contact guard assist Stair Management: One rail Right, One rail Left, Step to pattern, Forwards Number of Stairs: 10 General stair comments: Pt reports her step mother can assist when she initially gets home    Balance Overall balance assessment: Needs assistance Sitting-balance support: Single extremity supported, Feet supported, Feet unsupported Sitting balance-Leahy Scale: Normal     Standing balance support: Single extremity supported, During functional activity, No upper extremity supported Standing balance-Leahy Scale: Poor Standing balance comment: ataxic movements, poor balance, improves with HHA       Pertinent Vitals/Pain Pain Assessment Pain Assessment: No/denies pain    Home Living Family/patient expects to be discharged to:: Private residence Living Arrangements: Alone Available Help at Discharge: Family;Friend(s);Available 24 hours/day (step mother and friends) Type of Home: Apartment Home Access: Stairs to enter Entrance Stairs-Rails: Right;Left;Can reach both Entrance Stairs-Number of Steps: 3 flights   Home Layout: One level Home Equipment: None      Prior Function Prior Level of Function : Independent/Modified Independent;Working/employed;Driving             Mobility Comments: reports she has  had falls but only when she is intoxicated. REports she is very active can walk 5 miles  a day when sober. ADLs Comments: Ind with ADL's and IADL's.     Extremity/Trunk Assessment   Upper Extremity Assessment Upper Extremity Assessment: Overall WFL for tasks assessed    Lower Extremity Assessment Lower Extremity Assessment: Overall WFL for tasks assessed (pt has some ataxic movements most likely related to alcohol withdrawal)    Cervical / Trunk Assessment Cervical / Trunk Assessment: Normal  Communication   Communication Communication: No apparent difficulties    Cognition Arousal: Alert Behavior During Therapy: WFL for tasks assessed/performed   PT - Cognitive impairments: No apparent impairments     Following commands: Intact       Cueing Cueing Techniques: Verbal cues     General Comments General comments (skin integrity, edema, etc.): step mother present in room and beginning/end of session. No signs/symptoms of cardiac/respiratory distress.        Assessment/Plan    PT Assessment Patient needs continued PT services  PT Problem List Decreased activity tolerance;Decreased balance;Decreased mobility       PT Treatment Interventions DME instruction;Balance training;Gait training;Stair training;Functional mobility training;Patient/family education;Therapeutic activities;Therapeutic exercise    PT Goals (Current goals can be found in the Care Plan section)  Acute Rehab PT Goals Patient Stated Goal: to get better PT Goal Formulation: With patient Time For Goal Achievement: 01/03/24 Potential to Achieve Goals: Good    Frequency Min 2X/week        AM-PAC PT 6 Clicks Mobility  Outcome Measure Help needed turning from your back to your side while in a flat bed without using bedrails?: None Help needed moving from lying on your back to sitting on the side of a flat bed without using bedrails?: None Help needed moving to and from a bed to a chair (including a wheelchair)?: A Little Help needed standing up from a chair using your arms (e.g.,  wheelchair or bedside chair)?: A Little Help needed to walk in hospital room?: A Little Help needed climbing 3-5 steps with a railing? : A Little 6 Click Score: 20    End of Session Equipment Utilized During Treatment: Gait belt Activity Tolerance: Patient tolerated treatment well Patient left: in bed;with call bell/phone within reach;with family/visitor present;with nursing/sitter in room Nurse Communication: Mobility status PT Visit Diagnosis: Unsteadiness on feet (R26.81)    Time: 1347-1401 PT Time Calculation (min) (ACUTE ONLY): 14 min   Charges:   PT Evaluation $PT Eval Low Complexity: 1 Low   PT General Charges $$ ACUTE PT VISIT: 1 Visit        Dorothyann Maier, DPT, CLT  Acute Rehabilitation Services Office: [phone] (Secure chat preferred)   Dorothyann VEAR Maier 12/20/2023, 2:17 PM

## 2023-12-20 NOTE — Progress Notes (Signed)
 Received phone call from behavioral health, pt accepted to behavioral health Caitlin Brown, accepting physician Dr. Ilsa, bed is ready for today or tomorrow  [name]

## 2023-12-21 ENCOUNTER — Encounter (HOSPITAL_COMMUNITY): Payer: Self-pay

## 2023-12-21 ENCOUNTER — Other Ambulatory Visit: Payer: Self-pay

## 2023-12-21 ENCOUNTER — Inpatient Hospital Stay (HOSPITAL_COMMUNITY)
Admission: AD | Admit: 2023-12-21 | Discharge: 2023-12-26 | DRG: 885 | Disposition: A | Discharge status: Home / Self Care | Source: Intra-hospital

## 2023-12-21 DIAGNOSIS — Z62812 Personal history of neglect in childhood: Secondary | ICD-10-CM

## 2023-12-21 DIAGNOSIS — Z59868 Other specified financial insecurity: Secondary | ICD-10-CM

## 2023-12-21 DIAGNOSIS — F10251 Alcohol dependence with alcohol-induced psychotic disorder with hallucinations: Secondary | ICD-10-CM | POA: Diagnosis present

## 2023-12-21 DIAGNOSIS — F1020 Alcohol dependence, uncomplicated: Secondary | ICD-10-CM | POA: Diagnosis present

## 2023-12-21 DIAGNOSIS — F10231 Alcohol dependence with withdrawal delirium: Secondary | ICD-10-CM | POA: Diagnosis present

## 2023-12-21 DIAGNOSIS — F10288 Alcohol dependence with other alcohol-induced disorder: Secondary | ICD-10-CM | POA: Diagnosis present

## 2023-12-21 DIAGNOSIS — R569 Unspecified convulsions: Secondary | ICD-10-CM | POA: Diagnosis present

## 2023-12-21 DIAGNOSIS — F332 Major depressive disorder, recurrent severe without psychotic features: Secondary | ICD-10-CM | POA: Diagnosis present

## 2023-12-21 DIAGNOSIS — R45851 Suicidal ideations: Secondary | ICD-10-CM | POA: Diagnosis present

## 2023-12-21 DIAGNOSIS — F4010 Social phobia, unspecified: Secondary | ICD-10-CM | POA: Diagnosis present

## 2023-12-21 DIAGNOSIS — F10229 Alcohol dependence with intoxication, unspecified: Secondary | ICD-10-CM | POA: Diagnosis present

## 2023-12-21 DIAGNOSIS — Z635 Disruption of family by separation and divorce: Secondary | ICD-10-CM | POA: Diagnosis not present

## 2023-12-21 DIAGNOSIS — R030 Elevated blood-pressure reading, without diagnosis of hypertension: Secondary | ICD-10-CM | POA: Diagnosis present

## 2023-12-21 DIAGNOSIS — R4589 Other symptoms and signs involving emotional state: Secondary | ICD-10-CM | POA: Diagnosis present

## 2023-12-21 DIAGNOSIS — G4700 Insomnia, unspecified: Secondary | ICD-10-CM | POA: Diagnosis present

## 2023-12-21 DIAGNOSIS — Z87891 Personal history of nicotine dependence: Secondary | ICD-10-CM

## 2023-12-21 DIAGNOSIS — Z79899 Other long term (current) drug therapy: Secondary | ICD-10-CM

## 2023-12-21 DIAGNOSIS — Z98891 History of uterine scar from previous surgery: Secondary | ICD-10-CM

## 2023-12-21 LAB — COMPREHENSIVE METABOLIC PANEL WITH GFR
ALT: 76 U/L — ABNORMAL HIGH (ref 0–44)
AST: 80 U/L — ABNORMAL HIGH (ref 15–41)
Albumin: 3.9 g/dL (ref 3.5–5.0)
Alkaline Phosphatase: 56 U/L (ref 38–126)
Anion gap: 10 (ref 5–15)
BUN: 8 mg/dL (ref 6–20)
CO2: 19 mmol/L — ABNORMAL LOW (ref 22–32)
Calcium: 9 mg/dL (ref 8.9–10.3)
Chloride: 104 mmol/L (ref 98–111)
Creatinine, Ser: 0.85 mg/dL (ref 0.44–1.00)
GFR, Estimated: >60 mL/min (ref >60)
Glucose, Bld: 81 mg/dL (ref 70–99)
Potassium: 3.9 mmol/L (ref 3.5–5.1)
Sodium: 133 mmol/L — ABNORMAL LOW (ref 135–145)
Total Bilirubin: 1 mg/dL (ref 0.0–1.2)
Total Protein: 7.6 g/dL (ref 6.5–8.1)

## 2023-12-21 LAB — HOMOCYSTEINE: Homocysteine: 14.1 umol/L (ref 0.0–14.5)

## 2023-12-21 MED ORDER — LORAZEPAM 2 MG/ML IJ SOLN: 2.0000 mg | Freq: Three times a day (TID) | INTRAMUSCULAR | Status: DC | PRN

## 2023-12-21 MED ORDER — SERTRALINE HCL 50 MG PO TABS: 75.0000 mg | ORAL_TABLET | Freq: Every day | ORAL | Status: DC

## 2023-12-21 MED ORDER — SERTRALINE HCL 50 MG PO TABS
75.0000 mg | ORAL_TABLET | Freq: Every day | ORAL | Status: DC
  Administered 2023-12-21: 75 mg via ORAL
  Filled 2023-12-21: qty 1

## 2023-12-21 MED ORDER — DIPHENHYDRAMINE HCL 50 MG/ML IJ SOLN: 50.0000 mg | Freq: Three times a day (TID) | INTRAMUSCULAR | Status: DC | PRN

## 2023-12-21 MED ORDER — ADULT MULTIVITAMIN W/MINERALS CH
1.0000 | ORAL_TABLET | Freq: Every day | ORAL | Status: DC
  Administered 2023-12-22 – 2023-12-26 (×5): 1 via ORAL
  Filled 2023-12-21 (×5): qty 1
  Filled 2023-12-21: qty 7

## 2023-12-21 MED ORDER — FOLIC ACID 1 MG PO TABS
1.0000 mg | ORAL_TABLET | Freq: Every day | ORAL | Status: DC
  Administered 2023-12-22 – 2023-12-26 (×5): 1 mg via ORAL
  Filled 2023-12-21 (×3): qty 1
  Filled 2023-12-21: qty 7
  Filled 2023-12-21 (×2): qty 1

## 2023-12-21 MED ORDER — MAGNESIUM HYDROXIDE 400 MG/5ML PO SUSP: 30.0000 mL | Freq: Every day | ORAL | Status: DC | PRN

## 2023-12-21 MED ORDER — ALUM & MAG HYDROXIDE-SIMETH 200-200-20 MG/5ML PO SUSP: 30.0000 mL | ORAL | Status: DC | PRN

## 2023-12-21 MED ORDER — HALOPERIDOL 5 MG PO TABS
5.0000 mg | ORAL_TABLET | Freq: Three times a day (TID) | ORAL | Status: DC | PRN
Start: 2023-12-21 — End: 2023-12-26

## 2023-12-21 MED ORDER — THIAMINE HCL 100 MG PO TABS: 100.0000 mg | ORAL_TABLET | Freq: Every day | ORAL | Status: DC

## 2023-12-21 MED ORDER — DIPHENHYDRAMINE HCL 25 MG PO CAPS
50.0000 mg | ORAL_CAPSULE | Freq: Three times a day (TID) | ORAL | Status: DC | PRN
Start: 2023-12-21 — End: 2023-12-26

## 2023-12-21 MED ORDER — ACETAMINOPHEN 325 MG PO TABS: 650.0000 mg | ORAL_TABLET | Freq: Four times a day (QID) | ORAL | Status: DC | PRN

## 2023-12-21 MED ORDER — HALOPERIDOL LACTATE 5 MG/ML IJ SOLN: 5.0000 mg | Freq: Three times a day (TID) | INTRAMUSCULAR | Status: DC | PRN

## 2023-12-21 MED ORDER — ENSURE PLUS HIGH PROTEIN PO LIQD
237.0000 mL | Freq: Two times a day (BID) | ORAL | Status: DC
  Administered 2023-12-22: 237 mL via ORAL
  Filled 2023-12-21 (×12): qty 237

## 2023-12-21 MED ORDER — HYDROXYZINE HCL 25 MG PO TABS
25.0000 mg | ORAL_TABLET | Freq: Three times a day (TID) | ORAL | Status: DC | PRN
Start: 2023-12-21 — End: 2023-12-26
  Administered 2023-12-21 – 2023-12-25 (×10): 25 mg via ORAL
  Filled 2023-12-21 (×7): qty 1
  Filled 2023-12-21: qty 10
  Filled 2023-12-21 (×3): qty 1

## 2023-12-21 MED ORDER — TRAZODONE HCL 50 MG PO TABS
50.0000 mg | ORAL_TABLET | Freq: Every evening | ORAL | Status: DC | PRN
Start: 2023-12-21 — End: 2023-12-26
  Administered 2023-12-21 – 2023-12-25 (×5): 50 mg via ORAL
  Filled 2023-12-21 (×3): qty 1
  Filled 2023-12-21: qty 7
  Filled 2023-12-21 (×2): qty 1

## 2023-12-21 MED ORDER — HALOPERIDOL LACTATE 5 MG/ML IJ SOLN: 10.0000 mg | Freq: Three times a day (TID) | INTRAMUSCULAR | Status: DC | PRN

## 2023-12-21 NOTE — Progress Notes (Signed)
 Patient belongings, phone and AVS sent with her to the transport.

## 2023-12-21 NOTE — Discharge Summary (Signed)
 Physician Discharge Summary  Caitlin Brown FMW:[id number] DOB: 1981/02/16  PCP: Claudene Pellet, MD  Admitted from: Home Discharged to: [facility]  Admit date: 12/17/2023 Discharge date: 12/21/2023  Recommendations for Outpatient Follow-up:    Follow-up Information     Claudene Pellet, MD. Schedule an appointment as soon as possible for a visit.   Specialty: Family Medicine Why: To be seen in 1 week with repeat labs (CBC & CMP). Contact information: [address] [place] [address] [phone]                  Home Health: None    Equipment/Devices: None    Discharge Condition: Improved and stable.   Code Status: Full Code Diet recommendation:  Discharge Diet Orders (From admission, onward)     Start     Ordered   12/21/23 0000  Diet general        12/21/23 1127             Discharge Diagnoses:  Principal Problem:   Alcohol withdrawal (HCC) Active Problems:   Acute alcoholic intoxication in alcoholism with complication (HCC)   Severe episode of recurrent major depressive disorder, without psychotic features [facility])   Brief Hospital Course:  43 year old female with medical history significant for depression, alcohol use disorder, previous alcohol withdrawal and related seizures, who went to behavioral urgent care for evaluation of suicidal ideation and was sent to the ED due to concern for alcohol withdrawal.  In ED, tachycardic.  Blood alcohol level 65.  Admitted for alcohol intoxication, alcohol withdrawal, suicidal ideations.  Improving.  Psychiatry consulted and plan inpatient behavioral health admission after medically cleared.  As per psychiatry, patient has agreed for voluntary admission at this time but if she attempts to leave the hospital, she will need to be involuntarily committed.    Clinically improved.     Assessment & Plan:    Alcohol use disorder Alcohol intoxication Alcohol withdrawal History of  withdrawal seizures Mild transaminitis. Presented to the ED with tachycardia up to 138, hypertension to 135/99.  Elevated lactate/2.3. Blood alcohol level: 365. Monitored closely and managed for alcohol withdrawal with CIWA protocol.  She has clinically improved with low CIWA scores over the last 24 hours.  Withdrawal seems to have resolved. 10/20: Patient reported blurred vision, unsteady gait.  Requested PT and Neurology evaluation.  Concern for Wernicke's encephalopathy.   PT has outpatient recommendations.  OT is seeing patient now.  Neurology input appreciated.  They felt that her ataxic and unsteady gait yesterday was probably due to vitamin deficiencies or peripheral neuropathy.  They did not think that sending of her thiamine  level would be helpful since she was already getting thiamine  in the hospital.  B12 and folate levels were checked and normal.  MMA, B6 level and homocystine levels are drawn and results are pending which can be followed up as outpatient.  Communicated with neurology today, they have cleared her for discharge on multivitamins and thiamine .  She could follow-up with neurology as outpatient for EMG/nerve conduction studies-ambulatory referral to neurology has been placed. Clinically she is much improved.  Withdrawal appears to have resolved.  Blurred vision has improved but this seems to be a chronic issue for her related to her contact lenses as per her report.  Unsteadiness of gait has also improved.  OT is currently working with her. She has mild stable transaminitis related to her alcohol use disorder probably.  This can be followed up as outpatient  with labs.   Major depressive disorder, severe Suicidal ideations Psychiatry consultation 10/19 appreciated.  Continue Zoloft , CIWA protocol and they recommend inpatient psychiatric hospitalization when medically cleared. Continue 1: 1 Recruitment consultant.  Suicide precautions. Patient is medically stable for DC to inpatient  psychiatry today.   E. coli acute cystitis Pansensitive E. coli on urine culture.  Has completed 3 days of IV ceftriaxone, completed course, discontinued ceftriaxone.   Body mass index is 25.18 kg/m.       Consultants:   Psychiatry Neurology   Procedures:       Discharge Instructions  Discharge Instructions     Ambulatory referral to Neurology   Complete by: As directed    An appointment is requested in approximately: 2 weeks   Call MD for:   Complete by: As directed    Recurrent or persistent suicidal ideations.   Call MD for:  difficulty breathing, headache or visual disturbances   Complete by: As directed    Call MD for:  extreme fatigue   Complete by: As directed    Call MD for:  persistant dizziness or light-headedness   Complete by: As directed    Call MD for:  persistant nausea and vomiting   Complete by: As directed    Call MD for:  severe uncontrolled pain   Complete by: As directed    Call MD for:  temperature >100.4   Complete by: As directed    Diet general   Complete by: As directed    Increase activity slowly   Complete by: As directed         Medication List     TAKE these medications    acetaminophen  325 MG tablet Commonly known as: TYLENOL  Take 650 mg by mouth every 6 (six) hours as needed for mild pain.   ferrous sulfate 325 (65 FE) MG tablet Take 325 mg by mouth daily.   multivitamin with minerals Tabs tablet Take 1 tablet by mouth daily.   sertraline  50 MG tablet Commonly known as: ZOLOFT  Take 1.5 tablets (75 mg total) by mouth at bedtime. Will need prescriptions for new dose of medications when released from [facility]. What changed:  how much to take additional instructions   thiamine  100 MG tablet Commonly known as: VITAMIN B1 Take 1 tablet (100 mg total) by mouth daily. Patient will need a prescription for this upon discharge from [facility].   traZODone 100 MG tablet Commonly known as: DESYREL Take 100 mg by mouth at bedtime.        No Known Allergies    Procedures/Studies: DG Chest Portable 1 View Result Date: 12/17/2023 CLINICAL DATA:  Tachycardia EXAM: PORTABLE CHEST 1 VIEW COMPARISON:  10/29/2003 FINDINGS: The heart size and mediastinal contours are within normal limits. Both lungs are clear. The visualized skeletal structures are unremarkable. IMPRESSION: No active disease. Electronically Signed   By: Ozell Daring M.D.   On: 12/17/2023 22:33      Subjective: Seen along with OT at bedside.  Standing up and getting ready to be evaluated by OT.  Feels much better.  Blurred vision has improved but a chronic intermittent problem for her.  Appears to be in good spirits.  Smiling.  Unsteadiness of gait has also improved.  No suicidal ideations or any other complaints reported.  Discharge Exam:  Vitals:   12/20/23 1606 12/20/23 1953 12/21/23 0511 12/21/23 1020  BP: 121/87 (!) 136/100 111/78 (!) 132/97  Pulse: (!) 108 98 85 94  Resp: 18 18 20  18  Temp: 98.5 F (36.9 C) 98.7 F (37.1 C) 98.4 F (36.9 C)   TempSrc: Oral Oral    SpO2: 99% 97% 98% 99%  Weight:      Height:        General exam: Young female, moderately built and nourished sitting standing up next to the bed along with OT.  Looks much improved compared to the last 2 days.  Appears to be in great spirits.  Smiling. Respiratory system: Clear to auscultation.  No increased work of breathing. Cardiovascular system: S1 & S2 heard, RRR. No JVD, murmurs, rubs, gallops or clicks. No pedal edema.  Gastrointestinal system: Abdomen is nondistended, soft and nontender. No organomegaly or masses felt. Normal bowel sounds heard. Central nervous system: Alert and oriented. PERTLA.  No cranial nerve deficits.  No nystagmus or diplopia.  No focal neurological deficits. Extremities: Symmetric 5 x 5 power. Skin: No rashes, lesions or ulcers Psychiatry: Judgement and insight appear normal. Mood & affect tearful.    The results of significant diagnostics  from this hospitalization (including imaging, microbiology, ancillary and laboratory) are listed below for reference.     Microbiology: Recent Results (from the past 240 hours)  Urine Culture     Status: Abnormal   Collection Time: 12/17/23  9:41 PM   Specimen: Urine, Random  Result Value Ref Range Status   Specimen Description URINE, RANDOM  Final   Special Requests   Final    NONE Reflexed from [id number] Performed at [facility], [address], [place], [place] [address]    Culture >=100,000 COLONIES/mL ESCHERICHIA COLI (A)  Final   Report Status 12/19/2023 FINAL  Final   Organism ID, Bacteria ESCHERICHIA COLI (A)  Final      Susceptibility   Escherichia coli - MIC*    AMPICILLIN 4 SENSITIVE Sensitive     CEFAZOLIN (URINE) Value in next row Sensitive      <=1 SENSITIVEThis is a modified FDA-approved test that has been validated and its performance characteristics determined by the reporting laboratory.  This laboratory is certified under the Clinical Laboratory Improvement Amendments CLIA as qualified to perform high complexity clinical laboratory testing.    CEFEPIME Value in next row Sensitive      <=1 SENSITIVEThis is a modified FDA-approved test that has been validated and its performance characteristics determined by the reporting laboratory.  This laboratory is certified under the Clinical Laboratory Improvement Amendments CLIA as qualified to perform high complexity clinical laboratory testing.    ERTAPENEM Value in next row Sensitive      <=1 SENSITIVEThis is a modified FDA-approved test that has been validated and its performance characteristics determined by the reporting laboratory.  This laboratory is certified under the Clinical Laboratory Improvement Amendments CLIA as qualified to perform high complexity clinical laboratory testing.    CEFTRIAXONE Value in next row Sensitive      <=1 SENSITIVEThis is a modified FDA-approved test that has been validated and its  performance characteristics determined by the reporting laboratory.  This laboratory is certified under the Clinical Laboratory Improvement Amendments CLIA as qualified to perform high complexity clinical laboratory testing.    CIPROFLOXACIN Value in next row Sensitive      <=1 SENSITIVEThis is a modified FDA-approved test that has been validated and its performance characteristics determined by the reporting laboratory.  This laboratory is certified under the Clinical Laboratory Improvement Amendments CLIA as qualified to perform high complexity clinical laboratory testing.    GENTAMICIN Value in  next row Sensitive      <=1 SENSITIVEThis is a modified FDA-approved test that has been validated and its performance characteristics determined by the reporting laboratory.  This laboratory is certified under the Clinical Laboratory Improvement Amendments CLIA as qualified to perform high complexity clinical laboratory testing.    NITROFURANTOIN Value in next row Sensitive      <=1 SENSITIVEThis is a modified FDA-approved test that has been validated and its performance characteristics determined by the reporting laboratory.  This laboratory is certified under the Clinical Laboratory Improvement Amendments CLIA as qualified to perform high complexity clinical laboratory testing.    TRIMETH/SULFA Value in next row Sensitive      <=1 SENSITIVEThis is a modified FDA-approved test that has been validated and its performance characteristics determined by the reporting laboratory.  This laboratory is certified under the Clinical Laboratory Improvement Amendments CLIA as qualified to perform high complexity clinical laboratory testing.    AMPICILLIN/SULBACTAM Value in next row Sensitive      <=1 SENSITIVEThis is a modified FDA-approved test that has been validated and its performance characteristics determined by the reporting laboratory.  This laboratory is certified under the Clinical Laboratory Improvement  Amendments CLIA as qualified to perform high complexity clinical laboratory testing.    PIP/TAZO Value in next row Sensitive      <=4 SENSITIVEThis is a modified FDA-approved test that has been validated and its performance characteristics determined by the reporting laboratory.  This laboratory is certified under the Clinical Laboratory Improvement Amendments CLIA as qualified to perform high complexity clinical laboratory testing.    MEROPENEM Value in next row Sensitive      <=4 SENSITIVEThis is a modified FDA-approved test that has been validated and its performance characteristics determined by the reporting laboratory.  This laboratory is certified under the Clinical Laboratory Improvement Amendments CLIA as qualified to perform high complexity clinical laboratory testing.    * >=100,000 COLONIES/mL ESCHERICHIA COLI  Resp panel by RT-PCR (RSV, Flu A&B, Covid) Urine, Clean Catch     Status: None   Collection Time: 12/17/23  9:49 PM   Specimen: Urine, Clean Catch; Nasal Swab  Result Value Ref Range Status   SARS Coronavirus 2 by RT PCR NEGATIVE NEGATIVE Final   Influenza A by PCR NEGATIVE NEGATIVE Final   Influenza B by PCR NEGATIVE NEGATIVE Final    Comment: (NOTE) The Xpert Xpress SARS-CoV-2/FLU/RSV plus assay is intended as an aid in the diagnosis of influenza from Nasopharyngeal swab specimens and should not be used as a sole basis for treatment. Nasal washings and aspirates are unacceptable for Xpert Xpress SARS-CoV-2/FLU/RSV testing.  Fact Sheet for Patients: BloggerCourse.com  Fact Sheet for Healthcare Providers: SeriousBroker.it  This test is not yet approved or cleared by the [place]  FDA and has been authorized for detection and/or diagnosis of SARS-CoV-2 by FDA under an Emergency Use Authorization (EUA). This EUA will remain in effect (meaning this test can be used) for the duration of the COVID-19 declaration under  Section 564(b)(1) of the Act, 21 U.S.C. section 360bbb-3(b)(1), unless the authorization is terminated or revoked.     Resp Syncytial Virus by PCR NEGATIVE NEGATIVE Final    Comment: (NOTE) Fact Sheet for Patients: BloggerCourse.com  Fact Sheet for Healthcare Providers: SeriousBroker.it  This test is not yet approved or cleared by the [place]  FDA and has been authorized for detection and/or diagnosis of SARS-CoV-2 by FDA under an Emergency Use Authorization (EUA). This EUA will remain in effect (meaning this  test can be used) for the duration of the COVID-19 declaration under Section 564(b)(1) of the Act, 21 U.S.C. section 360bbb-3(b)(1), unless the authorization is terminated or revoked.  Performed at [facility], [address] [place], [place] [address]      Labs: CBC: Recent Labs  Lab 12/17/23 2219 12/18/23 0813 12/20/23 0547  WBC 4.7 4.2 4.0  HGB 12.8 11.8* 13.3  HCT 38.5 36.0 38.6  MCV 94.6 95.5 91.9  PLT 234 176 174    Basic Metabolic Panel: Recent Labs  Lab 12/17/23 2219 12/18/23 0813 12/19/23 0440 12/20/23 0547 12/21/23 0426  NA 144  --  136 134* 133*  K 4.0  --  3.6 3.5 3.9  CL 105  --  103 103 104  CO2 23  --  20* 19* 19*  GLUCOSE 109*  --  86 82 81  BUN 8  --  <5* 6 8  CREATININE 0.70 0.64 0.71 0.69 0.85  CALCIUM 8.3*  --  8.2* 8.8* 9.0  MG 1.9  --   --  1.8  --   PHOS 3.7  --   --   --   --     Liver Function Tests: Recent Labs  Lab 12/17/23 2219 12/20/23 0547 12/21/23 0426  AST 55* 76* 80*  ALT 43 65* 76*  ALKPHOS 57 51 56  BILITOT 0.3 0.8 1.0  PROT 7.4 7.4 7.6  ALBUMIN 4.1 4.0 3.9    CBG: Recent Labs  Lab 12/19/23 2102  GLUCAP 99    Anemia work up Recent Labs    12/20/23 1550  VITAMINB12 453  FOLATE >20.0    Urinalysis    Component Value Date/Time   COLORURINE YELLOW 12/17/2023 2141   APPEARANCEUR HAZY (A) 12/17/2023 2141   LABSPEC 1.010  12/17/2023 2141   PHURINE 5.0 12/17/2023 2141   GLUCOSEU NEGATIVE 12/17/2023 2141   HGBUR MODERATE (A) 12/17/2023 2141   BILIRUBINUR NEGATIVE 12/17/2023 2141   KETONESUR NEGATIVE 12/17/2023 2141   PROTEINUR 30 (A) 12/17/2023 2141   NITRITE POSITIVE (A) 12/17/2023 2141   LEUKOCYTESUR MODERATE (A) 12/17/2023 2141      Time coordinating discharge: 35 minutes  SIGNED:  Trenda Mar, MD,  FACP, [facility], [facility], [facility]   [facility] [place]     To contact the attending provider between 7A-7P or the covering provider during after hours 7P-7A, please log into the web site [email or web address] and access using universal [place] password for that web site. If you do not have the password, please call the hospital operator.

## 2023-12-21 NOTE — TOC Transition Note (Signed)
 Transition of Care [facility]) - Discharge Note   Patient Details  Name: Caitlin Brown MRN: [id number] Date of Birth: [birth year]  Transition of Care [facility]) CM/SW Contact:  Lendia Dais, LCSWA Phone Number: 12/21/2023, 12:27 PM   Clinical Narrative:  Pt is discharging to [facility]. CSW called safe transport at 1217 and Merlynn stated transport will arrive within 20 minutes.  CSW spoke to [facility] pt emergency contact via phone and informed her of discharge and transportation. Pam requested the location of [facility], CSW gave requested information. Pam expressed gratitude.  No further TOC needs.    Final next level of care: Psychiatric Hospital Barriers to Discharge: Barriers Resolved   Patient Goals and CMS Choice Patient states their goals for this hospitalization and ongoing recovery are:: Inpatient Psych   Choice offered to / list presented to : NA      Discharge Placement              Patient chooses bed at: Other - please specify in the comment section below: [facility]) Patient to be transferred to facility by: Safe Transport Name of family member notified: Holley Linen (step mother) Patient and family notified of of transfer: 12/21/23  Discharge Plan and Services Additional resources added to the After Visit Summary for   In-house Referral: Clinical Social Work                                   Social Drivers of Health (SDOH) Interventions SDOH Screenings   Food Insecurity: No Food Insecurity (12/18/2023)  Housing: Low Risk  (12/18/2023)  Transportation Needs: No Transportation Needs (12/18/2023)  Utilities: Not At Risk (12/18/2023)  Tobacco Use: Medium Risk (12/17/2023)     Readmission Risk Interventions     No data to display

## 2023-12-21 NOTE — Consult Note (Addendum)
 [place] [facility] Consult Follow-up  Patient Name: .Caitlin Brown  MRN: [id number]  DOB: 1980/12/18  Consult Order details:  Orders (From admission, onward)     Start     Ordered   12/18/23 1228  IP CONSULT TO PSYCHIATRY       Ordering Provider: Georgina Basket, MD  Provider:  (Not yet assigned)  Question Answer Comment  Location [facility]   Reason for Consult? SI      12/18/23 1227             Mode of Visit: In person   Psychiatry Consult Evaluation  Service Date: December 21, 2023 LOS:  LOS: 3 days  Chief Complaint Suicidal ideations  Primary Psychiatric Diagnoses  Major Depressive Disorder, recurrent episode, severe w/o psychotic features Alcohol Use Disorder, severe dependence  Assessment  Caitlin Brown is a 43 y.o. female admitted: Medically for 12/17/2023  8:14 PM for alcohol use disorder and complications. She carries the psychiatric diagnoses of MDD, GAD and AUD and has a past medical history of  seizures.   Her initial presentation of tearfulness, low mood, hopelessness, guilt, and increased alcohol use is most consistent with a Major Depressive Episode in the context of Alcohol Use Disorder, severe. She meets criteria for Major Depressive Disorder, recurrent, moderate to severe, based on persistent depressive symptoms lasting more than two weeks, anhedonia, insomnia, decreased appetite, feelings of worthlessness and guilt, and recurrent thoughts of death that intensify during alcohol use.  Current outpatient psychotropic medication includes sertraline  (Zoloft ), prescribed by her primary care provider, and historically she has had a partial but generally positive response to this medication. She was partially compliant with her medication prior to admission, as evidenced by self-reported adherence with intermittent missed doses during periods of alcohol relapse.  On initial examination, the patient appeared alert, oriented, cooperative, and  tearful with depressed mood and congruent affect. Thought process was linear and goal-directed with no evidence of psychosis. She denied current suicidal ideation, though acknowledged passive fleeting thoughts in the past when intoxicated. Insight and judgment were limited regarding alcohol use.  The patient presented with suicidal thoughts in the context of heavy alcohol use and withdrawal symptoms. She reported long-standing depression with worsening mood, poor coping, and increased alcohol intake over the past several months. Her alcohol consumption pattern includes binge drinking on weekends and maintenance drinking during weekdays to prevent withdrawal. Despite recent suicidal ideation, she denies current intent or plan once sober and medically stabilized. Her presentation is consistent with alcohol-induced mood disturbance superimposed on recurrent major depressive disorder.  Patient would benefit from inpatient psychiatric hospitalization once medically cleared to work on coping skills and therapy. She is agreeable to this, and also willing to pursue substance use treatment following inpatient psychiatric hospitalization.   Please see plan below for detailed recommendations.  Diagnoses:  Active Hospital problems: Principal Problem:   Alcohol withdrawal (HCC) Active Problems:   Acute alcoholic intoxication in alcoholism with complication (HCC)   Severe episode of recurrent major depressive disorder, without psychotic features (HCC)   Plan   ## Psychiatric Medication Recommendations:  -Continue Zoloft  75 mg daily -Continue CIWA protocol -Continue Alcohol detox protocol  ## Medical Decision Making Capacity: Not specifically addressed in this encounter  ## Further Work-up:  -- B12, folate and TSH TSH, B12, folate, EKG, While pt on Qtc prolonging medications, please monitor & replete K+ to 4 and Mg2+ to 2, TOC consult for substance abuse resources, U/A, or UDS -- most  recent EKG on  10/17 had QTc of 429 -- Pertinent labwork reviewed earlier this admission includes: BAL 365, Lactic 2.3  ## Disposition:-- We recommend inpatient psychiatric hospitalization when medically cleared. Patient is under voluntary admission status at this time; please IVC if attempts to leave hospital. She has consented to go voluntary for treatment.   ## Behavioral / Environmental: - No specific recommendations at this time.    ## Safety and Observation Level:  - Based on my clinical evaluation, I estimate the patient to be at moderate risk of self harm in the current setting. - At this time, we recommend  1:1 Observation. This decision is based on my review of the chart including patient's history and current presentation, interview of the patient, mental status examination, and consideration of suicide risk including evaluating suicidal ideation, plan, intent, suicidal or self-harm behaviors, risk factors, and protective factors. This judgment is based on our ability to directly address suicide risk, implement suicide prevention strategies, and develop a safety plan while the patient is in the clinical setting. Please contact our team if there is a concern that risk level has changed.  CSSR Risk Category: moderate risk  Suicide Risk Assessment: Patient has following modifiable risk factors for suicide: under treated depression , social isolation, recklessness, current symptoms: anxiety/panic, insomnia, impulsivity, anhedonia, hopelessness, and triggering events, which we are addressing by inpatient psych admission and medication management. Patient has following non-modifiable or demographic risk factors for suicide: separation or divorce and history of self harm behavior Patient has the following protective factors against suicide: Access to outpatient mental health care, Supportive family, Supportive friends, Cultural, spiritual, or religious beliefs that discourage suicide, Pets in the home,  Frustration tolerance, no history of suicide attempts, and no history of NSSIB  Thank you for this consult request. Recommendations have been communicated to the primary team.  We will continue to follow at this time.   Alfornia Light, DO      History of Present Illness  Relevant Aspects of [facility] Course:  Admitted on 12/17/2023 with a history of major depressive disorder, alcohol use disorder, prior alcohol withdrawal, and seizure history who initially presented to Behavioral Health Urgent Care with suicidal ideation and was transferred to the Emergency Department for evaluation and management of possible alcohol withdrawal. On arrival, she was tachycardic and started on a CIWA protocol for alcohol withdrawal management. Laboratory work revealed ethanol level of 365 mg/dL, UA with pyuria and bacteriuria, and lactate of 2.3 decreasing to 1.6. Chest x-ray was negative for acute process. She received IV ceftriaxone and 1 liter of IV fluids prior to admission to the medical floor. There were no fevers or leukocytosis noted.  Patient Report:  Patient reports drinking two large bottles of wine daily, sometimes supplemented with seltzers if she remains awake. She describes her weekday pattern as consuming up to a twelve-pack of seltzers to "keep from shaking" and heavier drinking on weekends. She acknowledges feeling depressed, hopeless, and frustrated about her alcohol use and its consequences, including strained family relationships and recurrent hospitalizations. She denies current suicidal ideation or intent but admits to fleeting suicidal thoughts when intoxicated. She reports fatigue, insomnia, decreased appetite, and guilt over her inability to remain abstinent. She states she has experienced similar episodes in the past and typically seeks help before her symptoms escalate. Currently, she expresses motivation to stabilize medically and work toward recovery but notes limited social supports and  difficulty maintaining sobriety.  12/20/2023: Patient was seen in room resting  in bed watching TV.  She says that she slept very well last night, better than she has in a long time.  She says she is not very hungry and had pickles for lunch.  She denies hallucinations for the past 2 days.  She said previously she thought there is stuff coming out of the walls and she had been seeing demons, but this is now resolved.  We had brief discussion about her alcohol use disorder and how she has been using this to deal with her shame and guilt surrounding her previous affair and divorce.  Patient said that she felt so much shame with her affair, that she did not take anything (monetary or property) from the divorce, and has been dealing with heightened anxiety due to income differences between herself and her ex-husband.  She endorses anxiety around her children growing up, and then having preference to stay with dad because he has the bigger house.  We discussed her need for inpatient psychiatric hospitalization, and patient was initially somewhat hesitant about this.  She says that she had been denying SI, and would never kill herself because she loves her children.  We then discussed the reason for inpatient psychiatric hospitalization was not only for her SI, but for patient to orient better coping skills and being able to handle and deal with stressors, and her intense shame and guilt without relying on alcohol.  She then felt more comfortable with psychiatric hospitalization, and was agreeable to this.  12/21/2023: Patient examined at bedside.  Patient says she slept well last night, and had a banana for breakfast.  She endorses her appetite is still poor.  She says alcohol withdrawals have not been too bad.  We discussed if she is ready to go to [facility]. She is hesitant and says she is no longer sure she would like to go. We discussed her hesitancy, and patient says she would really like to get home and see her  kids, she feels as though she is not getting any support while she is here and is distressed being confined to her room and does not want to go to the behavioral health hospital where it would be more of the same.    Patient says she does believe the inpatient psychiatric hospitalization would be beneficial for her, however this hospitalization has been distressing and she is afraid of psychiatric hospitalization. We discussed inpatient psychiatric hospitalization would not be as isolative as this hospital stay, and that she would be doing group activities and group therapy with other patients.  We also discussed that she would be allowed visitors while she is there for support.  Patient is agreeable to psychiatric hospitalization, but says she would like to do this today as she no longer wants to be at this hospital.  Psych ROS:  Depression: Yes worthlessness, guilty, insomnia, sadness, hopeless, suicidal ideation passive, tearfulness Anxiety:  Yes excessive worry Mania (lifetime and current): Denies Psychosis: (lifetime and current): denies  Collateral information:  Patient needed phone to get phone numbers for collateral.   ROS Psychiatric and Social History  Psychiatric History:  Information collected from Patient and chart review  Prev Dx/Sx: Depression and anxiety Current Psych Provider: None Home Meds (current): Zoloft  Previous Med Trials: Zoloft  Therapy: Denies  Prior Psych Hospitalization: Denies  Prior Self Harm: Denies Prior Violence: Denies  Family Psych History: Denies Family Hx suicide: Denies  Social History:  Developmental Hx: WNL Educational Hx: Bachelors Occupational Hx: Careers information officer Hx: 2  DWI. None currently Living Situation: Lives alone in an apartment Spiritual Hx: Catholic Access to weapons/lethal means: Denies   Substance History Alcohol: Yes Type of alcohol WIne and seltzers Last Drink Friday Number of drinks per day Binge drinking  (2) 1.75 L bottles History of alcohol withdrawal seizures Denies History of DT's Denies Tobacco: Denies Illicit drugs: Denies Prescription drug abuse: Denies Rehab hx: 2018 inpatient residential  Exam Findings  Physical Exam: Age appropriate caucasian female, lying supine in bed.  Vital Signs:  Temp:  [98.4 F (36.9 C)-98.7 F (37.1 C)] 98.4 F (36.9 C) (10/21 0511) Pulse Rate:  [85-108] 94 (10/21 1020) Resp:  [18-20] 18 (10/21 1020) BP: (111-136)/(78-100) 132/97 (10/21 1020) SpO2:  [97 %-99 %] 99 % (10/21 1020) Blood pressure (!) 132/97, pulse 94, temperature 98.4 F (36.9 C), resp. rate 18, height 5' 6 (1.676 m), weight 70.8 kg, last menstrual period 12/14/2023, SpO2 99%. Body mass index is 25.18 kg/m.  Physical Exam Mental Status Exam: General Appearance: Casual and Fairly Groomed  Orientation:  Full (Time, Place, and Person)  Memory:  Immediate;   Good Recent;   Good  Concentration:  Concentration: Fair and Attention Span: Good  Recall:  Good  Attention  Fair  Eye Contact:  Good  Speech:  Clear and Coherent  Language:  Fair  Volume:  Normal  Mood: unknown  Affect:  Constricted and Tearful  Thought Process:  Coherent, Goal Directed, Linear, and Descriptions of Associations: Intact  Thought Content:  Logical  Suicidal Thoughts:  No  Homicidal Thoughts:  No  Judgement:  Fair  Insight:  Fair  Psychomotor Activity:  Normal  Akathisia:  No  Fund of Knowledge:  Fair      Assets:  Manufacturing systems engineer Desire for Improvement Financial Resources/Insurance Housing Intimacy Leisure Time Physical Health Resilience Social Support Talents/Skills Transportation Vocational/Educational  Cognition:  WNL  ADL's:  Intact  AIMS (if indicated):        Other History   These have been pulled in through the EMR, reviewed, and updated if appropriate.  Family History:  The patient's family history is not on file.  Medical History: Past Medical History:  Diagnosis  Date   Depression    Surgical History: Past Surgical History:  Procedure Laterality Date   CESAREAN SECTION     Medications:  No current facility-administered medications for this encounter. No current outpatient medications on file.  Facility-Administered Medications Ordered in Other Encounters:    acetaminophen  (TYLENOL ) tablet 650 mg, 650 mg, Oral, Q6H PRN, Zahlia Deshazer, DO   alum & mag hydroxide-simeth (MAALOX/MYLANTA) 200-200-20 MG/5ML suspension 30 mL, 30 mL, Oral, Q4H PRN, Aloysuis Ribaudo, DO   haloperidol (HALDOL) tablet 5 mg, 5 mg, Oral, TID PRN **AND** diphenhydrAMINE (BENADRYL) capsule 50 mg, 50 mg, Oral, TID PRN, Tashae Inda, DO   haloperidol lactate (HALDOL) injection 5 mg, 5 mg, Intramuscular, TID PRN **AND** diphenhydrAMINE (BENADRYL) injection 50 mg, 50 mg, Intramuscular, TID PRN **AND** LORazepam  (ATIVAN ) injection 2 mg, 2 mg, Intramuscular, TID PRN, Bernadetta Roell, DO   haloperidol lactate (HALDOL) injection 10 mg, 10 mg, Intramuscular, TID PRN **AND** diphenhydrAMINE (BENADRYL) injection 50 mg, 50 mg, Intramuscular, TID PRN **AND** LORazepam  (ATIVAN ) injection 2 mg, 2 mg, Intramuscular, TID PRN, Wenceslaus Gist, DO   feeding supplement (ENSURE PLUS HIGH PROTEIN) liquid 237 mL, 237 mL, Oral, BID BM, Lynnette Barter, MD   NOREEN ON 12/22/2023] folic acid  (FOLVITE ) tablet 1 mg, 1 mg, Oral, Daily, Lynnette Barter, MD   hydrOXYzine  (ATARAX ) tablet 25  mg, 25 mg, Oral, TID PRN, Makaiyah Schweiger, DO   magnesium hydroxide (MILK OF MAGNESIA) suspension 30 mL, 30 mL, Oral, Daily PRN, Traquan Duarte, DO   [START ON 12/22/2023] multivitamin with minerals tablet 1 tablet, 1 tablet, Oral, Daily, Lynnette Barter, MD   sertraline  (ZOLOFT ) tablet 75 mg, 75 mg, Oral, QHS, Ji, Andrew, MD   traZODone (DESYREL) tablet 50 mg, 50 mg, Oral, QHS PRN, Querida Beretta, DO  Allergies: No Known Allergies  Alfornia Light, DO PGY-1

## 2023-12-21 NOTE — Progress Notes (Signed)
 Occupational Therapy Evaluation Patient Details Name: Caitlin Brown MRN: [id number] DOB: 07-Oct-1980 Today's Date: 12/21/2023   History of Present Illness   Pt is 43 yo presenting to [facility] behavioral health on 10/17 due to suicidal ideation, alcohol withdrawal with UTI and dehydration. PMH: depression, alcohol abuse, previous alcohol withdrawal and seizures.     Clinical Impressions Pt is typically independent in ADL/IADL, and mobility. Enjoys walking her dog 4-5 miles daily. She works in Engineer, maintenance (IT) at [employer]. Today she was able to complete UB and LB ADL at mod I - even donning socks in standing without UE support. She was able to demonstrate standing tolerance for balance to complete sink level grooming and reports showering herself yesterday without incident. Pt was able to walk around the unit and has 2 incidents of self-corrected LOB (sway) during visual assessment. Pt reports that vision is back to normal (wears contacts at baseline- and they have been in a while so she reports some blurryness) she was functional and the only error that she made was calling the number 5, a B during sideways visual assessment during mobility. OT will sign off acutely at this time. She is able to complete ADL at a sufficient level for [facility]. She will benefit from OT in the [facility] setting for mental health, and to continue to ensure vision has truly returned to baseline.      If plan is discharge home, recommend the following:   Other (comment) (plan to DC to [facility])     Functional Status Assessment   Patient has had a recent decline in their functional status and demonstrates the ability to make significant improvements in function in a reasonable and predictable amount of time.     Equipment Recommendations   None recommended by OT     Recommendations for Other Services         Precautions/Restrictions   Precautions Precautions: Fall Recall of Precautions/Restrictions: Intact Restrictions Weight  Bearing Restrictions Per Provider Order: No     Mobility Bed Mobility Overal bed mobility: Independent                  Transfers Overall transfer level: Modified independent Equipment used: None               General transfer comment: 2 self-corrected LOB with head turns      Balance Overall balance assessment: Mild deficits observed, not formally tested                               Standardized Balance Assessment Standardized Balance Assessment :  (able to adjust socks standing on one leg without UE support and without LOB)         ADL either performed or assessed with clinical judgement   ADL Overall ADL's : At baseline;Modified independent                                       General ADL Comments: Pt able to demonstrate UB dressing, LB dressing (sit<>stand) hallway mobility (slightly unsteady) has showered herself and able to take herself to the bathroom without needing assist     Vision Baseline Vision/History: 1 Wears glasses (contacts) Ability to See in Adequate Light: 0 Adequate Patient Visual Report: No change from baseline (returned to baseline. still wearing contacts) Vision Assessment?: No apparent visual deficits  Perception         Praxis         Pertinent Vitals/Pain Pain Assessment Pain Assessment: No/denies pain     Extremity/Trunk Assessment Upper Extremity Assessment Upper Extremity Assessment: Overall WFL for tasks assessed (Pt able to demonstrate several basic fine motor tasks without assist - she reports that she was tremoring a little initially, but no more)   Lower Extremity Assessment Lower Extremity Assessment: Defer to PT evaluation   Cervical / Trunk Assessment Cervical / Trunk Assessment: Normal   Communication Communication Communication: No apparent difficulties   Cognition Arousal: Alert Behavior During Therapy: WFL for tasks assessed/performed Cognition: No apparent  impairments                               Following commands: Intact       Cueing  General Comments   Cueing Techniques: Verbal cues      Exercises     Shoulder Instructions      Home Living Family/patient expects to be discharged to:: Private residence Living Arrangements: Alone Available Help at Discharge: Family;Friend(s);Available 24 hours/day (step mother) Type of Home: Apartment Home Access: Stairs to enter [employer] of Steps: 3 flights Entrance Stairs-Rails: Right;Left;Can reach both Home Layout: One level     Bathroom Shower/Tub: Chief Strategy Officer: Standard Bathroom Accessibility: No   Home Equipment: None   Additional Comments: has a dog that she likes to walk 4 miles a day      Prior Functioning/Environment Prior Level of Function : Independent/Modified Independent;Working/employed;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 REports she is very active can walk 5 miles a day when sober. ADLs Comments: Ind with ADL's and IADL's.    OT Problem List: Impaired balance (sitting and/or standing)   OT Treatment/Interventions:        OT Goals(Current goals can be found in the care plan section)   Acute Rehab OT Goals Patient Stated Goal: feel better, be in a better place OT Goal Formulation: With patient Time For Goal Achievement: 12/28/23 Potential to Achieve Goals: Good   OT Frequency:       Co-evaluation              AM-PAC OT 6 Clicks Daily Activity     Outcome Measure Help from another person eating meals?: None Help from another person taking care of personal grooming?: None Help from another person toileting, which includes using toliet, bedpan, or urinal?: None Help from another person bathing (including washing, rinsing, drying)?: None Help from another person to put on and taking off regular upper  body clothing?: None Help from another person to put on and taking off regular lower body clothing?: None 6 Click Score: 24   End of Session Equipment Utilized During Treatment: Gait belt Nurse Communication: Mobility status  Activity Tolerance: Patient tolerated treatment well Patient left: in bed;with nursing/sitter in room  OT Visit Diagnosis: Unsteadiness on feet (R26.81)                Time: 1110-1131 OT Time Calculation (min): 21 min Charges:  OT General Charges $OT Visit: 1 Visit OT Evaluation $OT Eval Low Complexity: 1 Low  Leita DEL OTR/L Acute Rehabilitation Services Office: [phone]  Leita JINNY Odea 12/21/2023, 11:41 AM

## 2023-12-21 NOTE — Group Note (Signed)
 LCSW Group Therapy Note   Group Date: 12/21/2023 Start Time: 1300 End Time: 1350   Type of Therapy and Topic:  Group Therapy: Building Healthy Relationships  Participation Level:  Did Not Attend  Description of Group: This group will address the use of boundaries in their personal lives. Patients will explore why boundaries are important, the difference between healthy and unhealthy boundaries, and negative and postive outcomes of different boundaries and will look at how boundaries can be crossed.  Patients will be encouraged to identify current boundaries in their own lives and identify what kind of boundary is being set. Facilitators will guide patients in utilizing problem-solving interventions to address and correct types boundaries being used and to address when no boundary is being used. Understanding and applying boundaries will be explored and addressed for obtaining and maintaining a balanced life. Patients will be encouraged to explore ways to assertively make their boundaries and needs known to significant others in their lives, using other group members and facilitator for role play, support, and feedback. Objective:  To explore loneliness, boundaries, and safe ways to build relationships. Participants discussed loneliness, healthy connections, and setting boundaries. They explored safe ways to meet people and shared personal experiences. Key insights were reinforced through discussion and quotes.   Goals: Recognize healthy vs. unhealthy relationships. Learn safe ways to connect with others. Strengthen communication and [employer].    Therapeutic Modalities Used: Cognitive Behavioral Therapy (CBT) Elements - Identifying unhealthy relationship patterns, challenging negative thoughts about connection. Dialectical Behavior Therapy (DBT) Elements - Interpersonal effectiveness, setting and maintaining boundaries. Supportive Group Therapy - Peer discussion, shared  experiences, and emotional validation.  Summary of Patient Progress: Patient did not attend.   Louetta Lame, LCSWA 12/21/2023  2:55 PM

## 2023-12-21 NOTE — Tx Team (Signed)
 Initial Treatment Plan 12/21/2023 2:50 PM Caitlin Brown FMW:[id number]    PATIENT STRESSORS: Substance abuse     PATIENT STRENGTHS: Ability for insight  Average or above average intelligence  Capable of independent living  Supportive family/friends    PATIENT IDENTIFIED PROBLEMS: Alcohol abuse: I told my sister I would kill myself when I was drunk.                     DISCHARGE CRITERIA:  Improved stabilization in mood, thinking, and/or behavior Motivation to continue treatment in a less acute level of care  PRELIMINARY DISCHARGE PLAN: Attend 12-step recovery group  PATIENT/FAMILY INVOLVEMENT: This treatment plan has been presented to and reviewed with the patient, [name].  The patient has been given the opportunity to ask questions and make suggestions.  Mercie VEAR Banana, RN 12/21/2023, 2:50 PM

## 2023-12-21 NOTE — Progress Notes (Signed)
 Alan LITTIE Molt to be discharged [facility] per MD order. Patient verbalized understanding. Gave report to [place] at [facility] and answered all her questions.  Skin clean, dry and intact without evidence of skin break down, no evidence of skin tears noted. IV catheter discontinued intact. Site without signs and symptoms of complications. Dressing and pressure applied. Pt denies pain at the site currently. No complaints noted.  Patient free of lines, drains, and wounds.   Discharge packet assembled. An After Visit Summary (AVS) was printed and given to the EMS personnel. Patient escorted via w/c to the main entrance and then by the safe transport.    to designated [facility]. Report called to accepting facility; all questions and concerns addressed.   Franciso Bodily, RN

## 2023-12-21 NOTE — Progress Notes (Signed)
 Caitlin Brown is a 43 year old female who was voluntarily admitted. Patient's was brought to the ED due to concern for alcohol withdrawal. Patient has a history of suicidal thoughts when she drink. Patient says I get suicidal when I drink. Patient states that she drinks 5 to 6 wine glasses daily. Patient says when she was drinking, I told my sister I would kill myself when I was drunk. Patient says her last drink was on Thursday, October, 16th, 2025. As of admission, she denies SI, HI, and AVH. Skin assessment was completed with Shanda, RN and Demario, MHT. Her BP (139/97) and pulse (103) were a bit elevated. Patient reports that she lives alone. Consent form was signed. Patient's items were searched and placed in locker #32. Patient was overall calm and cooperative.  Mercie VEAR Banana, RN 4:19 PM

## 2023-12-21 NOTE — Group Note (Signed)
 Date:  12/21/2023 Time:  4:22 PM  Group Topic/Focus:  Developing a Wellness Toolbox:   The focus of this group is to help patients develop a wellness toolbox with skills and strategies to promote recovery upon discharge.    Participation Level:  Active  Participation Quality:  Appropriate  Affect:  Appropriate  Cognitive:  Appropriate  Insight: Appropriate  Engagement in Group:  Engaged  Modes of Intervention:  Discussion   Annalee  Darnise Montag 12/21/2023, 4:22 PM

## 2023-12-21 NOTE — TOC Progression Note (Signed)
 Transition of Care [facility]) - Progression Note    Patient Details  Name: Caitlin Brown MRN: [id number] Date of Birth: 1980/06/06  Transition of [facility]) CM/SW Contact  Lendia Dais, [place] Phone Number: 12/21/2023, 8:56 AM  Clinical Narrative:  CSW spoke to [place] of [facility] and stated that upon medically stability a bed is available for the pt.    Expected Discharge Plan: Psychiatric Hospital Barriers to Discharge: Continued Medical Work up, Psych Bed not available               Expected Discharge Plan and Services In-house Referral: Clinical Social Work     Living arrangements for the past 2 months: Apartment                                       Social Drivers of Health (SDOH) Interventions SDOH Screenings   Food Insecurity: No Food Insecurity (12/18/2023)  Housing: Low Risk  (12/18/2023)  Transportation Needs: No Transportation Needs (12/18/2023)  Utilities: Not At Risk (12/18/2023)  Tobacco Use: Medium Risk (12/17/2023)    Readmission Risk Interventions     No data to display

## 2023-12-21 NOTE — Plan of Care (Signed)
   Problem: Education: Goal: Knowledge of General Education information will improve Description: Including pain rating scale, medication(s)/side effects and non-pharmacologic comfort measures Outcome: Progressing   Problem: Clinical Measurements: Goal: Respiratory complications will improve Outcome: Progressing   Problem: Activity: Goal: Risk for activity intolerance will decrease Outcome: Progressing

## 2023-12-21 NOTE — Plan of Care (Signed)
   Problem: Education: Goal: Knowledge of [place] [facility] Education information/materials will improve Outcome: Progressing Goal: Verbalization of understanding the information provided will improve Outcome: Progressing

## 2023-12-22 ENCOUNTER — Encounter (HOSPITAL_COMMUNITY): Payer: Self-pay

## 2023-12-22 DIAGNOSIS — F1020 Alcohol dependence, uncomplicated: Secondary | ICD-10-CM | POA: Diagnosis present

## 2023-12-22 MED ORDER — ONDANSETRON 4 MG PO TBDP: 4.0000 mg | ORAL_TABLET | Freq: Four times a day (QID) | ORAL | Status: AC | PRN

## 2023-12-22 MED ORDER — LORAZEPAM 1 MG PO TABS: 1.0000 mg | ORAL_TABLET | Freq: Four times a day (QID) | ORAL | Status: AC | PRN

## 2023-12-22 MED ORDER — SERTRALINE HCL 100 MG PO TABS
100.0000 mg | ORAL_TABLET | Freq: Every day | ORAL | Status: DC
  Administered 2023-12-22 – 2023-12-25 (×4): 100 mg via ORAL
  Filled 2023-12-22 (×2): qty 1
  Filled 2023-12-22: qty 7
  Filled 2023-12-22 (×2): qty 1

## 2023-12-22 MED ORDER — LOPERAMIDE HCL 2 MG PO CAPS: 2.0000 mg | ORAL_CAPSULE | ORAL | Status: AC | PRN

## 2023-12-22 NOTE — BHH Group Notes (Signed)
 Psychoeducational Group Note  Date:  12/22/2023 Time:  0424  Group Topic/Focus:  Wrap-Up Group:   The focus of this group is to help patients review their daily goal of treatment and discuss progress on daily workbooks.  Participation Level: Did Not Attend  Participation Quality:  Not Applicable  Affect:  Not Applicable  Cognitive:  Not Applicable  Insight:  Not Applicable  Engagement in Group: Not Applicable  Additional Comments:  The patient did not attend group.   Taylor Spilde S 12/22/2023, 4:24 AM
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
## 2023-12-22 NOTE — BH IP Treatment Plan (Signed)
 Interdisciplinary Treatment and Diagnostic Plan Update  12/22/2023 Time of Session: 1053am Caitlin Brown MRN: [id number]  Principal Diagnosis: MDD (major depressive disorder), recurrent episode, severe (HCC)  Secondary Diagnoses: Principal Problem:   MDD (major depressive disorder), recurrent episode, severe (HCC)   Current Medications:  Current Facility-Administered Medications  Medication Dose Route Frequency Provider Last Rate Last Admin   acetaminophen  (TYLENOL ) tablet 650 mg  650 mg Oral Q6H PRN Faunce, Alina, DO       alum & mag hydroxide-simeth (MAALOX/MYLANTA) 200-200-20 MG/5ML suspension 30 mL  30 mL Oral Q4H PRN Faunce, Alina, DO       haloperidol (HALDOL) tablet 5 mg  5 mg Oral TID PRN Faunce, Alina, DO       And   diphenhydrAMINE (BENADRYL) capsule 50 mg  50 mg Oral TID PRN Faunce, Alina, DO       haloperidol lactate (HALDOL) injection 5 mg  5 mg Intramuscular TID PRN Faunce, Alina, DO       And   diphenhydrAMINE (BENADRYL) injection 50 mg  50 mg Intramuscular TID PRN Faunce, Alina, DO       And   LORazepam  (ATIVAN ) injection 2 mg  2 mg Intramuscular TID PRN Faunce, Alina, DO       haloperidol lactate (HALDOL) injection 10 mg  10 mg Intramuscular TID PRN Faunce, Alina, DO       And   diphenhydrAMINE (BENADRYL) injection 50 mg  50 mg Intramuscular TID PRN Faunce, Alina, DO       And   LORazepam  (ATIVAN ) injection 2 mg  2 mg Intramuscular TID PRN Faunce, Alina, DO       feeding supplement (ENSURE PLUS HIGH PROTEIN) liquid 237 mL  237 mL Oral BID BM Lynnette Barter, MD   237 mL at 12/22/23 9060   folic acid  (FOLVITE ) tablet 1 mg  1 mg Oral Daily Ji, Andrew, MD   1 mg at 12/22/23 9060   hydrOXYzine  (ATARAX ) tablet 25 mg  25 mg Oral TID PRN Faunce, Alina, DO   25 mg at 12/22/23 0940   magnesium hydroxide (MILK OF MAGNESIA) suspension 30 mL  30 mL Oral Daily PRN Faunce, Alina, DO       multivitamin with minerals tablet 1 tablet  1 tablet Oral Daily Lynnette Barter, MD   1 tablet at  12/22/23 9060   sertraline  (ZOLOFT ) tablet 100 mg  100 mg Oral QHS Elodie Alan, MD       traZODone (DESYREL) tablet 50 mg  50 mg Oral QHS PRN Faunce, Alina, DO   50 mg at 12/21/23 2112   PTA Medications: Medications Prior to Admission  Medication Sig Dispense Refill Last Dose/Taking   acetaminophen  (TYLENOL ) 325 MG tablet Take 650 mg by mouth every 6 (six) hours as needed for mild pain.      ferrous sulfate 325 (65 FE) MG tablet Take 325 mg by mouth daily.      Multiple Vitamin (MULTIVITAMIN WITH MINERALS) TABS tablet Take 1 tablet by mouth daily.      sertraline  (ZOLOFT ) 50 MG tablet Take 1.5 tablets (75 mg total) by mouth at bedtime. Will need prescriptions for new dose of medications when released from [facility].      thiamine  (VITAMIN B1) 100 MG tablet Take 1 tablet (100 mg total) by mouth daily. Patient will need a prescription for this upon discharge from [facility].      traZODone (DESYREL) 100 MG tablet Take 100 mg by mouth at bedtime.  Patient Stressors: Substance abuse    Patient Strengths: Ability for insight  Average or above average intelligence  Capable of independent living  Supportive family/friends   Treatment Modalities: Medication Management, Group therapy, Case management,  1 to 1 session with clinician, Psychoeducation, Recreational therapy.   Physician Treatment Plan for Primary Diagnosis: MDD (major depressive disorder), recurrent episode, severe (HCC) Long Term Goal(s):     Short Term Goals: Ability to identify changes in lifestyle to reduce recurrence of condition will improve Ability to demonstrate self-control will improve Ability to identify and develop effective coping behaviors will improve  Medication Management: Evaluate patient's response, side effects, and tolerance of medication regimen.  Therapeutic Interventions: 1 to 1 sessions, Unit Group sessions and Medication administration.  Evaluation of Outcomes: Not Progressing  Physician Treatment Plan  for Secondary Diagnosis: Principal Problem:   MDD (major depressive disorder), recurrent episode, severe (HCC)  Long Term Goal(s):     Short Term Goals: Ability to identify changes in lifestyle to reduce recurrence of condition will improve Ability to demonstrate self-control will improve Ability to identify and develop effective coping behaviors will improve     Medication Management: Evaluate patient's response, side effects, and tolerance of medication regimen.  Therapeutic Interventions: 1 to 1 sessions, Unit Group sessions and Medication administration.  Evaluation of Outcomes: Not Progressing   RN Treatment Plan for Primary Diagnosis: MDD (major depressive disorder), recurrent episode, severe (HCC) Long Term Goal(s): Knowledge of disease and therapeutic regimen to maintain health will improve  Short Term Goals: Ability to remain free from injury will improve, Ability to verbalize frustration and anger appropriately will improve, Ability to demonstrate self-control, Ability to participate in decision making will improve, Ability to verbalize feelings will improve, Ability to disclose and discuss suicidal ideas, Ability to identify and develop effective coping behaviors will improve, and Compliance with prescribed medications will improve  Medication Management: RN will administer medications as ordered by provider, will assess and evaluate patient's response and provide education to patient for prescribed medication. RN will report any adverse and/or side effects to prescribing provider.  Therapeutic Interventions: 1 on 1 counseling sessions, Psychoeducation, Medication administration, Evaluate responses to treatment, Monitor vital signs and CBGs as ordered, Perform/monitor CIWA, COWS, AIMS and Fall Risk screenings as ordered, Perform wound care treatments as ordered.  Evaluation of Outcomes: Not Progressing   LCSW Treatment Plan for Primary Diagnosis: MDD (major depressive  disorder), recurrent episode, severe (HCC) Long Term Goal(s): Safe transition to appropriate next level of care at discharge, Engage patient in therapeutic group addressing interpersonal concerns.  Short Term Goals: Engage patient in aftercare planning with referrals and resources, Increase social support, Increase ability to appropriately verbalize feelings, Increase emotional regulation, Facilitate acceptance of mental health diagnosis and concerns, Facilitate patient progression through stages of change regarding substance use diagnoses and concerns, Identify triggers associated with mental health/substance abuse issues, and Increase skills for wellness and recovery  Therapeutic Interventions: Assess for all discharge needs, 1 to 1 time with Social worker, Explore available resources and support systems, Assess for adequacy in community support network, Educate family and significant other(s) on suicide prevention, Complete Psychosocial Assessment, Interpersonal group therapy.  Evaluation of Outcomes: Not Progressing   Progress in Treatment: Attending groups: Yes. Participating in groups: Yes. Taking medication as prescribed: Yes. Toleration medication: Yes. Family/Significant other contact made: No, will contact:  consents pending Patient understands diagnosis: Yes. Discussing patient identified problems/goals with staff: Yes. Medical problems stabilized or resolved: Yes. Denies suicidal/homicidal ideation: Yes. Issues/concerns  per patient self-inventory: No.  New problem(s) identified: No, Describe:  none  New Short Term/Long Term Goal(s): detox, medication management for mood stabilization; elimination of SI thoughts; development of comprehensive mental wellness/sobriety plan   Patient Goals:  Get to the root of the problem for why I am drinking  Discharge Plan or Barriers: Patient recently admitted. CSW will continue to follow and assess for appropriate referrals and possible  discharge planning.    Reason for Continuation of Hospitalization: Depression Medication stabilization Withdrawal symptoms  Estimated Length of Stay: 5-7 days  Last 3 [place] Suicide Severity Risk Score: Flowsheet Row [facility] (Current[facility] Most recent reading at 12/21/2023  1:00 PM ED to Hosp-Admission (Discharged) from 12/17/2023 in [facility] Most recent reading at 12/20/2023  7:48 PM ED from 12/17/2023 in [facility] Most recent reading at 12/17/2023  6:32 PM  C-SSRS RISK CATEGORY Low Risk Error: Question 1 not populated No Risk    Last PHQ 2/9 Scores:     No data to display          Scribe for Treatment Team: Jenkins LULLA Morley ISRAEL 12/22/2023 12:37 PM

## 2023-12-22 NOTE — Plan of Care (Signed)

## 2023-12-22 NOTE — Group Note (Signed)
 Date:  12/22/2023 Time:  3:57 PM  Group Topic/Focus: Spiritual Wellness   Pt did attend spiritual wellness group  Caitlin Brown 12/22/2023, 3:57 PM

## 2023-12-22 NOTE — Group Note (Signed)
 Date:  12/22/2023 Time:  10:36 AM  Group Topic/Focus: Recreation Therapy   Pt did attend recreation therapy group.   [name] R Jevonte Clanton 12/22/2023, 10:36 AM

## 2023-12-22 NOTE — Group Note (Signed)
 Recreation Therapy Group Note   Group Topic:Leisure Education  Group Date: 12/22/2023 Start Time: 0933 End Time: 1020 Facilitators: Regina Ganci-McCall, LRT,CTRS Location: [address] Dayroom   Group Topic: Leisure Education  Goal Area(s) Addresses:  Patient will identify positive leisure activities for use post discharge. Patient will identify at least one positive benefit of participation in leisure activities.  Patient will work effectively work with peer by sharing ideas and contributing to Social worker.  Behavioral Response: Engaged   Intervention: Innovation, Group Presentation   Activity: In pairs, patients were asked to create a community based program with their teammate. Team's were tasked with designing a program, including a Name, Demographic, Age group, Benefits, Hours of operation and What is offered.  Education:  Leisure Scientist, physiological, Special educational needs teacher, Teamwork, Discharge Planning  Education Outcome: Acknowledges education/In group clarification offered/Needs additional education.    Affect/Mood: Appropriate   Participation Level: Engaged   Participation Quality: Independent   Behavior: Appropriate   Speech/Thought Process: Focused   Insight: Good   Judgement: Good   Modes of Intervention: Art   Patient Response to Interventions:  Engaged   Education Outcome:  In group clarification offered    Clinical Observations/Individualized Feedback: Pt worked with peers in Paramedic. Pt was engaged, social and bright throughout activity. Pt offered suggestions such as clothes, baby supplies and various food items.      Plan: Continue to engage patient in RT group sessions 2-3x/week.   Delisa Finck-McCall, LRT,CTRS  12/22/2023 12:33 PM

## 2023-12-22 NOTE — Plan of Care (Signed)
   Problem: Education: Goal: Emotional status will improve Outcome: Progressing

## 2023-12-22 NOTE — Progress Notes (Cosign Needed)
(  Sleep Hours) -7.5 (Any PRNs that were needed, meds refused, or side effects to meds)- Atarax  and Trazodone  (Any disturbances and when (visitation, over night)-none (Concerns raised by the patient)-none (SI/HI/AVH)-denied

## 2023-12-22 NOTE — H&P (Signed)
 Adult Psychoeducational Group Note  Date:  12/22/2023 Time:  9:26 PM  Group Topic/Focus:  Wrap-Up Group:   The focus of this group is to help patients review their daily goal of treatment and discuss progress on daily workbooks.  Participation Level:  Active  Participation Quality:  Attentive  Affect:  Appropriate  Cognitive:  Alert  Insight: Appropriate  Engagement in Group:  Engaged  Modes of Intervention:  Discussion  Additional Comments:  Patient attended and participated in the Wrap-up group.  Caitlin Brown 12/22/2023, 9:26 PM
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
## 2023-12-22 NOTE — Group Note (Signed)
 Date:  12/22/2023 Time:  9:23 AM  Group Topic/Focus:  Goals Group:   The focus of this group is to help patients establish daily goals to achieve during treatment and discuss how the patient can incorporate goal setting into their daily lives to aide in recovery. Orientation:   The focus of this group is to educate the patient on the purpose and policies of crisis stabilization and provide a format to answer questions about their admission.  The group details unit policies and expectations of patients while admitted.  To create a supportive and motivating environment where members can clarify and set meaningful personal and professional goals, understand how fulfilling foundational needs (as outlined in Maslow's hierarchy) supports their growth, utilize positive affirmations to build self-confidence and resilience, and foster continuous personal development and self-actualization.  Participation Level:  Active  Participation Quality:  Appropriate  Affect:  Appropriate  Cognitive:  Appropriate  Insight: Appropriate  Engagement in Group:  Engaged and Supportive  Modes of Intervention:  Activity and Discussion  Additional Comments:    Robinson Brinkley R Chasmine Lender 12/22/2023, 9:23 AM

## 2023-12-22 NOTE — Progress Notes (Signed)
 Spiritual care group facilitated by Chaplain Rockie Sofia, [facility]  Group focused on topic of strength. Group members reflected on what thoughts and feelings emerge when they hear this topic. They then engaged in facilitated dialog around how strength is present in their lives. This dialog focused on representing what strength had been to them in their lives (images and patterns given) and what they saw as helpful in their life now (what they needed / wanted).  Activity drew on narrative framework.  Patient Progress: Caitlin Brown attended group and actively engaged and participated in group conversation.

## 2023-12-22 NOTE — Group Note (Signed)
 Date:  12/22/2023 Time:  5:39 PM  Group Topic/Focus:  Coping With Mental Health Crisis:   The purpose of this group is to help patients identify strategies for coping with mental health crisis.  Group discusses possible causes of crisis and ways to manage them effectively. Developing a Wellness Toolbox:   The focus of this group is to help patients develop a wellness toolbox with skills and strategies to promote recovery upon discharge.    Participation Level:  Active  Participation Quality:  Appropriate  Affect:  Appropriate  Cognitive:  Appropriate  Insight: Appropriate  Engagement in Group:  Engaged  Modes of Intervention:  Discussion  Additional Comments:    Caitlin Brown [place] 12/22/2023, 5:39 PM

## 2023-12-22 NOTE — Progress Notes (Signed)
   12/22/23 1000  Psych Admission Type (Psych Patients Only)  Admission Status Voluntary  Psychosocial Assessment  Patient Complaints Anxiety (10/10)  Eye Contact Fair  Facial Expression Anxious  Affect Anxious  Speech Logical/coherent  Interaction Other (Comment) (WNL)  Motor Activity Other (Comment) (WNL)  Appearance/Hygiene Unremarkable  Behavior Characteristics Appropriate to situation  Mood Pleasant  Thought Process  Coherency WDL  Content WDL  Delusions None reported or observed  Perception WDL  Hallucination None reported or observed  Judgment Limited  Confusion None  Danger to Self  Current suicidal ideation? Denies

## 2023-12-23 LAB — METHYLMALONIC ACID, SERUM: Methylmalonic Acid, Quantitative: 55 nmol/L (ref 0–378)

## 2023-12-23 LAB — VITAMIN B6: Vitamin B6: 12 ug/L (ref 3.4–65.2)

## 2023-12-23 MED ORDER — NICOTINE POLACRILEX 2 MG MT GUM
2.0000 mg | CHEWING_GUM | OROMUCOSAL | Status: DC | PRN
Start: 2023-12-23 — End: 2023-12-26

## 2023-12-23 MED ORDER — PROPRANOLOL HCL 10 MG PO TABS
10.0000 mg | ORAL_TABLET | Freq: Two times a day (BID) | ORAL | Status: DC
  Administered 2023-12-23 – 2023-12-26 (×6): 10 mg via ORAL
  Filled 2023-12-23 (×6): qty 1
  Filled 2023-12-23: qty 14

## 2023-12-23 NOTE — Progress Notes (Addendum)
 [facility] Inpatient Psychiatry Progress Note  Date: 12/23/2023 Patient: Caitlin Brown MRN: [id number]  Caitlin Brown is a 43 y.o. female  with a past psychiatric history of MDD and alcohol use disorder, severe. Patient initially arrived to [facility] on 10/17 for SI in the context of alcohol abuse, and admitted to [facility] on 10/22 for crisis stabalization.  No significant past medical history.   Assessment and Plan:  Her mood appears superficially improved reflecting minimization of illness severity rather than true stabilization. Her continued ambivalence toward inpatient rehabilitation, despite prior failed outpatient efforts and the severity of her use, reflects limited insight and poor judgment regarding the seriousness of her condition.   Continued inpatient monitoring is warranted while zoloft  titration progresses. She remains at moderate risk for relapse upon discharge, given her chronic alcohol dependence, limited insight, and preference to forgo higher levels of care.   # MDD, recurrent severe, anxiety - Continue Zoloft  100 mg daily at bedtime for depression, plan to increase to 150 mg on 10/23 -- Start propanolol 10 mg twice daily for elevated blood pressure (160s/100s) and continued anxiety, will monitor vitals to consider evaluation for primary hypertension vs withdrawal-related hypertension and use a longer-acting antihypertensive. Pulse at time of blood pressure was 81   # Alcohol withdrawal/ Alcohol Use Disorder - Discontinue naltrexone per patient request, will start Antabuse over the weekend with continued education  - Per stepmother, patient recently tried Antabuse and was not compliant due to distressing sickness with drinking - CIWA initiated due to history of severe alcohol use with DTs and seizures  Risk Assessment: Risk is moderate at this time. Ongoing monitoring, motivational interviewing, and careful discharge planning with linkage to a  structured CDIOP are essential to mitigate relapse and safety risks.  Discharge Planning: Barriers to Discharge: Medication management Estimated Length of Stay: 4-5 days Predicted Discharge Location: [facility] vs Home with CDIOP  Interval History: Chart reviewed. No significant events overnight. PRNs in the last 24 hours include hydroxyzine  and trazodone.   On assessment today, the patient reports feeling "great," and denies SI, HI, and AVH.  During medication consultation regarding options for alcohol use disorder, the patient expressed preference for Antabuse over naltrexone. When informed that her stepmother had reported a prior trial and intolerance to Antabuse, the patient initially denied any prior use, but later made statements suggesting familiarity with the medication's effects. This inconsistency raises concern for possible minimization or lack of insight regarding her prior treatment history.  The patient was also counseled on recommendations for inpatient substance use treatment at [facility], given her history of severe alcohol use, previous withdrawal seizures, and repeated relapses after brief periods of sobriety. Despite acknowledging understanding, she declined inpatient rehabilitation, instead expressing a strong preference for a CDIOP that offers evening sessions so as not to interfere with her job. Social work provided her with information and contact numbers for several CDIOPs in the area.  Patient denies any current withdrawal symptoms, tremors, or cravings, and her CIWA scores remain low. She was educated on the plan to titrate her sertraline  (Zoloft ) to the target dose of 150 mg daily, with an increase planned for tomorrow, followed by observation over the weekend for tolerability or adverse effects.  Physical Examination:  Vitals and nursing note reviewed  Musculoskeletal: Normal gait and station Strength & Muscle Tone: within normal limits Gait & Station:  normal  Mental Status Exam:  Appearance:Appears stated age, dressed appropriately, with good grooming and hygiene.  Behavior: Calm, cooperative, and engaged throughout the interview. No psychomotor agitation or retardation noted.  Attitude: Pleasant and polite  Speech: Normal rate, rhythm, and volume. Speech is coherent and goal-directed.  Mood: I'm feeling great!  Affect: Congruent  Thought Process: Linear, logical, and goal-directed.  Thought Content: No evidence of delusions, paranoia, or obsessions.   SI/HI: Denies  Perceptions: Denies auditory or visual hallucinations; no perceptual disturbances observed.  Judgement: Fair  Insight: Fair  Fund of Knowledge: WNL     Lab Results:  No visits with results within 1 Day(s) from this visit.  Latest known visit with results is:  Admission on 12/17/2023, Discharged on 12/21/2023  Component Date Value Ref Range Status   WBC 12/17/2023 4.7  4.0 - 10.5 K/uL Final   RBC 12/17/2023 4.07  3.87 - 5.11 MIL/uL Final   Hemoglobin 12/17/2023 12.8  12.0 - 15.0 g/dL Final   HCT 89/82/7974 38.5  36.0 - 46.0 % Final   MCV 12/17/2023 94.6  80.0 - 100.0 fL Final   MCH 12/17/2023 31.4  26.0 - 34.0 pg Final   MCHC 12/17/2023 33.2  30.0 - 36.0 g/dL Final   RDW 89/82/7974 17.6 (H)  11.5 - 15.5 % Final   Platelets 12/17/2023 234  150 - 400 K/uL Final   nRBC 12/17/2023 0.0  0.0 - 0.2 % Final   Sodium 12/17/2023 144  135 - 145 mmol/L Final   Potassium 12/17/2023 4.0  3.5 - 5.1 mmol/L Final   Chloride 12/17/2023 105  98 - 111 mmol/L Final   CO2 12/17/2023 23  22 - 32 mmol/L Final   Glucose, Bld 12/17/2023 109 (H)  70 - 99 mg/dL Final   BUN 89/82/7974 8  6 - 20 mg/dL Final   Creatinine, Ser 12/17/2023 0.70  0.44 - 1.00 mg/dL Final   Calcium 89/82/7974 8.3 (L)  8.9 - 10.3 mg/dL Final   Total Protein 89/82/7974 7.4  6.5 - 8.1 g/dL Final   Albumin 89/82/7974 4.1  3.5 - 5.0 g/dL Final   AST 89/82/7974 55 (H)  15 - 41 U/L Final   ALT 12/17/2023 43  0 -  44 U/L Final   Alkaline Phosphatase 12/17/2023 57  38 - 126 U/L Final   Total Bilirubin 12/17/2023 0.3  0.0 - 1.2 mg/dL Final   GFR, Estimated 12/17/2023 >60  >60 mL/min Final   Anion gap 12/17/2023 16 (H)  5 - 15 Final   Magnesium 12/17/2023 1.9  1.7 - 2.4 mg/dL Final   Phosphorus 89/82/7974 3.7  2.5 - 4.6 mg/dL Final   Lipase 89/82/7974 50  11 - 51 U/L Final   Lactic Acid, Venous 12/17/2023 2.3 (HH)  0.5 - 1.9 mmol/L Final   Comment 12/17/2023 NOTIFIED PHYSICIAN   Final   Preg, Serum 12/17/2023 NEGATIVE  NEGATIVE Final   Specimen Source 12/17/2023 URINE, CLEAN CATCH   Final   Color, Urine 12/17/2023 YELLOW  YELLOW Final   APPearance 12/17/2023 HAZY (A)  CLEAR Final   Specific Gravity, Urine 12/17/2023 1.010  1.005 - 1.030 Final   pH 12/17/2023 5.0  5.0 - 8.0 Final   Glucose, UA 12/17/2023 NEGATIVE  NEGATIVE mg/dL Final   Hgb urine dipstick 12/17/2023 MODERATE (A)  NEGATIVE Final   Bilirubin Urine 12/17/2023 NEGATIVE  NEGATIVE Final   Ketones, ur 12/17/2023 NEGATIVE  NEGATIVE mg/dL Final   Protein, ur 89/82/7974 30 (A)  NEGATIVE mg/dL Final   Nitrite 89/82/7974 POSITIVE (A)  NEGATIVE Final   Leukocytes,Ua 12/17/2023 MODERATE (A)  NEGATIVE  Final   RBC / HPF 12/17/2023 0-5  0 - 5 RBC/hpf Final   WBC, UA 12/17/2023 >50  0 - 5 WBC/hpf Final   Bacteria, UA 12/17/2023 MANY (A)  NONE SEEN Final   Squamous Epithelial / HPF 12/17/2023 0-5  0 - 5 /HPF Final   Mucus 12/17/2023 PRESENT   Final   Opiates 12/17/2023 NONE DETECTED  NONE DETECTED Final   Cocaine 12/17/2023 NONE DETECTED  NONE DETECTED Final   Benzodiazepines 12/17/2023 NONE DETECTED  NONE DETECTED Final   Amphetamines 12/17/2023 NONE DETECTED  NONE DETECTED Final   Tetrahydrocannabinol 12/17/2023 POSITIVE (A)  NONE DETECTED Final   Barbiturates 12/17/2023 NONE DETECTED  NONE DETECTED Final   Alcohol, Ethyl (B) 12/17/2023 365 (HH)  <15 mg/dL Final   TSH 89/82/7974 1.299  0.350 - 4.500 uIU/mL Final   SARS Coronavirus 2 by RT PCR  12/17/2023 NEGATIVE  NEGATIVE Final   Influenza A by PCR 12/17/2023 NEGATIVE  NEGATIVE Final   Influenza B by PCR 12/17/2023 NEGATIVE  NEGATIVE Final   Resp Syncytial Virus by PCR 12/17/2023 NEGATIVE  NEGATIVE Final   Specimen Description 12/17/2023 URINE, RANDOM   Final   Special Requests 12/17/2023    Final                   Value:NONE Reflexed from [id number] Performed at [facility], [address] [place], [place] [address]    Culture 12/17/2023 >=100,000 COLONIES/mL ESCHERICHIA COLI (A)   Final   Report Status 12/17/2023 12/19/2023 FINAL   Final   Organism ID, Bacteria 12/17/2023 ESCHERICHIA COLI (A)   Final   Lactic Acid, Venous 12/18/2023 1.6  0.5 - 1.9 mmol/L Final   HIV Screen 4th Generation wRfx 12/18/2023 Non Reactive  Non Reactive Final   WBC 12/18/2023 4.2  4.0 - 10.5 K/uL Final   RBC 12/18/2023 3.77 (L)  3.87 - 5.11 MIL/uL Final   Hemoglobin 12/18/2023 11.8 (L)  12.0 - 15.0 g/dL Final   HCT 89/81/7974 36.0  36.0 - 46.0 % Final   MCV 12/18/2023 95.5  80.0 - 100.0 fL Final   MCH 12/18/2023 31.3  26.0 - 34.0 pg Final   MCHC 12/18/2023 32.8  30.0 - 36.0 g/dL Final   RDW 89/81/7974 17.4 (H)  11.5 - 15.5 % Final   Platelets 12/18/2023 176  150 - 400 K/uL Final   nRBC 12/18/2023 0.0  0.0 - 0.2 % Final   Creatinine, Ser 12/18/2023 0.64  0.44 - 1.00 mg/dL Final   GFR, Estimated 12/18/2023 >60  >60 mL/min Final   Sodium 12/19/2023 136  135 - 145 mmol/L Final   Potassium 12/19/2023 3.6  3.5 - 5.1 mmol/L Final   Chloride 12/19/2023 103  98 - 111 mmol/L Final   CO2 12/19/2023 20 (L)  22 - 32 mmol/L Final   Glucose, Bld 12/19/2023 86  70 - 99 mg/dL Final   BUN 89/80/7974 <5 (L)  6 - 20 mg/dL Final   Creatinine, Ser 12/19/2023 0.71  0.44 - 1.00 mg/dL Final   Calcium 89/80/7974 8.2 (L)  8.9 - 10.3 mg/dL Final   GFR, Estimated 12/19/2023 >60  >60 mL/min Final   Anion gap 12/19/2023 13  5 - 15 Final   WBC 12/20/2023 4.0  4.0 - 10.5 K/uL Final   RBC 12/20/2023 4.20  3.87 - 5.11  MIL/uL Final   Hemoglobin 12/20/2023 13.3  12.0 - 15.0 g/dL Final   HCT 89/79/7974 38.6  36.0 - 46.0 % Final  MCV 12/20/2023 91.9  80.0 - 100.0 fL Final   MCH 12/20/2023 31.7  26.0 - 34.0 pg Final   MCHC 12/20/2023 34.5  30.0 - 36.0 g/dL Final   RDW 89/79/7974 15.8 (H)  11.5 - 15.5 % Final   Platelets 12/20/2023 174  150 - 400 K/uL Final   nRBC 12/20/2023 0.0  0.0 - 0.2 % Final   Sodium 12/20/2023 134 (L)  135 - 145 mmol/L Final   Potassium 12/20/2023 3.5  3.5 - 5.1 mmol/L Final   Chloride 12/20/2023 103  98 - 111 mmol/L Final   CO2 12/20/2023 19 (L)  22 - 32 mmol/L Final   Glucose, Bld 12/20/2023 82  70 - 99 mg/dL Final   BUN 89/79/7974 6  6 - 20 mg/dL Final   Creatinine, Ser 12/20/2023 0.69  0.44 - 1.00 mg/dL Final   Calcium 89/79/7974 8.8 (L)  8.9 - 10.3 mg/dL Final   Total Protein 89/79/7974 7.4  6.5 - 8.1 g/dL Final   Albumin 89/79/7974 4.0  3.5 - 5.0 g/dL Final   AST 89/79/7974 76 (H)  15 - 41 U/L Final   ALT 12/20/2023 65 (H)  0 - 44 U/L Final   Alkaline Phosphatase 12/20/2023 51  38 - 126 U/L Final   Total Bilirubin 12/20/2023 0.8  0.0 - 1.2 mg/dL Final   GFR, Estimated 12/20/2023 >60  >60 mL/min Final   Anion gap 12/20/2023 12  5 - 15 Final   Magnesium 12/20/2023 1.8  1.7 - 2.4 mg/dL Final   Glucose-Capillary 12/19/2023 99  70 - 99 mg/dL Final   Vitamin A-87 89/79/7974 453  180 - 914 pg/mL Final   Folate 12/20/2023 >20.0  >5.9 ng/mL Final   Vitamin B6 12/20/2023 12.0  3.4 - 65.2 ug/L Final   Homocysteine 12/20/2023 14.1  0.0 - 14.5 umol/L Final   Sodium 12/21/2023 133 (L)  135 - 145 mmol/L Final   Potassium 12/21/2023 3.9  3.5 - 5.1 mmol/L Final   Chloride 12/21/2023 104  98 - 111 mmol/L Final   CO2 12/21/2023 19 (L)  22 - 32 mmol/L Final   Glucose, Bld 12/21/2023 81  70 - 99 mg/dL Final   BUN 89/78/7974 8  6 - 20 mg/dL Final   Creatinine, Ser 12/21/2023 0.85  0.44 - 1.00 mg/dL Final   Calcium 89/78/7974 9.0  8.9 - 10.3 mg/dL Final   Total Protein 89/78/7974 7.6  6.5  - 8.1 g/dL Final   Albumin 89/78/7974 3.9  3.5 - 5.0 g/dL Final   AST 89/78/7974 80 (H)  15 - 41 U/L Final   ALT 12/21/2023 76 (H)  0 - 44 U/L Final   Alkaline Phosphatase 12/21/2023 56  38 - 126 U/L Final   Total Bilirubin 12/21/2023 1.0  0.0 - 1.2 mg/dL Final   GFR, Estimated 12/21/2023 >60  >60 mL/min Final   Anion gap 12/21/2023 10  5 - 15 Final     Vitals: Blood pressure 129/83, pulse 87, temperature 99.3 F (37.4 C), temperature source Oral, resp. rate 20, height 5' 6 (1.676 m), weight 66.2 kg, last menstrual period 12/14/2023, SpO2 100%.   NB: This note was created using a voice recognition software as a result there may be grammatical errors inadvertently enclosed that do not reflect the nature of this encounter. Every attempt is made to correct such errors.   Alan Maiden, MD PGY-1, Psychiatry Residency  12/23/2023, 5:10 PM
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
## 2023-12-23 NOTE — Group Note (Signed)
 Date:  12/23/2023 Time:  11:16 AM  Group Topic/Focus:  Building Self Esteem:   The Focus of this group is helping patients become aware of the effects of self-esteem on their lives, the things they and others do that enhance or undermine their self-esteem, seeing the relationship between their level of self-esteem and the choices they make and learning ways to enhance self-esteem. Managing Feelings:   The focus of this group is to identify what feelings patients have difficulty handling and develop a plan to handle them in a healthier way upon discharge.    Participation Level:  Active  Participation Quality:  Appropriate  Affect:  Appropriate  Cognitive:  Appropriate  Insight: Appropriate  Engagement in Group:  Improving  Modes of Intervention:  Discussion  Additional Comments:  Pt attended group and participated in  Self-Encouragement Group Therapy  Danyla Wattley E Saryna Kneeland 12/23/2023, 11:16 AM

## 2023-12-23 NOTE — Group Note (Signed)
 Date:  12/23/2023 Time:  11:59 AM  Group Topic/Focus:  Nutrition Group    Participation Level:  Active  Participation Quality:  Appropriate  Affect:  Appropriate  Cognitive:  Appropriate  Insight: Appropriate  Engagement in Group:  Improving  Modes of Intervention:  Discussion and Education  Additional Comments:  Pt attended Nutrition Group  Marcas Bowsher E Yamile Roedl 12/23/2023, 11:59 AM

## 2023-12-23 NOTE — Group Note (Signed)
 LCSW Group Therapy Note   Group Date: 12/23/2023 Start Time: 1100 End Time: 1200   Participation:  patient was present and actively participated in the discussion  Type of Therapy:  Group Therapy  Topic:  Finding Balance: Using Wise Mind for Thoughtful Decisions  Objective:  To help participants understand and apply the concept of Delsie Mind to make balanced, thoughtful decisions by integrating emotion and logic.  Goals: Learn the differences between Emotional Mind, Reasonable Mind, and [employer]. Recognize personal signs of Emotional and Reasonable Mind. Practice using [employer] in real-life scenarios.  Summary:  This class focused on Wise Mind--DBT's concept of balancing Emotional Mind and Reasonable Mind. We identified when we're in each state and practiced using Wise Mind to respond thoughtfully in real-life situations. By combining emotion and logic, participants can improve decision-making, manage challenges, and enhance relationships.  Therapeutic Modalities: Elements of Dialectical Behavior Therapy (DBT):  Mindfulness (noticing thoughts and emotions without judgment), Emotion Regulation (understanding and managing emotional responses), Distress Tolerance (coping with difficult situations without making them worse), Wise Mind (integrating emotion and reason for balanced decision-making) Elements of Cognitive Behavioral Therapy (CBT):  Identifying automatic thoughts, Challenging cognitive distortions, Using logic to reframe unhelpful thinking patterns   Catherene MALVA Dynes, LCSWA 12/23/2023  12:18 PM

## 2023-12-23 NOTE — Progress Notes (Signed)
   12/23/23 0300  Psych Admission Type (Psych Patients Only)  Admission Status Voluntary  Psychosocial Assessment  Patient Complaints Anxiety  Eye Contact Fair  Facial Expression Anxious  Affect Anxious  Speech Logical/coherent  Interaction Assertive  Motor Activity Other (Comment) (WDL)  Appearance/Hygiene Unremarkable  Behavior Characteristics Appropriate to situation  Mood Pleasant  Aggressive Behavior  Effect No apparent injury  Thought Process  Coherency WDL  Content WDL  Delusions None reported or observed  Perception WDL  Hallucination None reported or observed  Judgment Impaired  Confusion None  Danger to Self  Current suicidal ideation? Denies  Danger to Others  Danger to Others None reported or observed

## 2023-12-23 NOTE — Group Note (Signed)
 Recreation Therapy Group Note   Group Topic:Other  Group Date: 12/23/2023 Start Time: 1300 End Time: 1400 Facilitators: Serina Nichter-McCall, LRT,CTRS Location: [address] Dayroom   Activity Description/Intervention: Therapeutic Drumming. Patients with peers and staff were given the opportunity to engage in a leader facilitated [facility] Group Empowerment Drumming Circle with staff from the [employer], in partnership with [employer]. Teaching laboratory technician and trained [employer], Norleen Mon leading with LRT observing and documenting intervention and pt response. This evidenced-based practice targets 7 areas of health and wellbeing in the human experience including: stress-reduction, exercise, self-expression, camaraderie/support, nurturing, spirituality, and music-making (leisure).   Goal Area(s) Addresses:  Patient will engage in pro-social way in music group.  Patient will follow directions of drum leader on the first prompt. Patient will demonstrate no behavioral issues during group.  Patient will identify if a reduction in stress level occurs as a result of participation in therapeutic drum circle.    Education: Leisure exposure, Pharmacologist, Musical expression, Discharge Planning   Affect/Mood: Appropriate   Participation Level: Engaged   Participation Quality: Independent   Behavior: Appropriate   Speech/Thought Process: Focused   Insight: Good   Judgement: Good   Modes of Intervention: Teaching laboratory technician   Patient Response to Interventions:  Engaged   Education Outcome:  In group clarification offered    Clinical Observations/Individualized Feedback: Zanayah actively engaged in therapeutic drumming exercise and discussions. Pt was appropriate with peers, staff, and musical equipment for duration of programming.    Plan: Continue to engage patient in RT group sessions 2-3x/week.   Lasheka Kempner-McCall, LRT,CTRS 12/23/2023 2:51  PM

## 2023-12-23 NOTE — Plan of Care (Signed)
   Problem: Education: Goal: Knowledge of [place] [facility] Education information/materials will improve Outcome: Progressing Goal: Verbalization of understanding the information provided will improve Outcome: Progressing

## 2023-12-23 NOTE — Progress Notes (Signed)
(  Sleep Hours) -8.25 (Any PRNs that were needed, meds refused, or side effects to meds)- Trazodone and Atarax  (Any disturbances and when (visitation, over night)-none (Concerns raised by the patient)- none (SI/HI/AVH)-denied

## 2023-12-24 NOTE — Progress Notes (Signed)
(  Sleep Hours) -7.75 (Any PRNs that were needed, meds refused, or side effects to meds)-  (Any disturbances and when (visitation, over night)- (Concerns raised by the patient)- Pt became tearful when she realized her blood pressure was elevated .Pt believes the stress from being admitted inpatient is causing her anxiety to stay high which leads tyo her blood pressure staying high. Pt began to have a panic attack.Writer had pt to focus on deep breathing and to focus on the positives that she was told she would discharging Sunday.Pt stated she does not need to be admitted here and needs to leave.  (SI/HI/AVH)-denied

## 2023-12-24 NOTE — Plan of Care (Signed)
 Problem: Coping: Goal: Ability to verbalize frustrations and anger appropriately will improve Outcome: Progressing   Problem: Physical Regulation: Goal: Ability to maintain clinical measurements within normal limits will improve Outcome: Progressing   Problem: Safety: Goal: Periods of time without injury will increase Outcome: Progressing

## 2023-12-24 NOTE — Group Note (Signed)
 Recreation Therapy Group Note   Group Topic:Communication  Group Date: 12/24/2023 Start Time: 0935 End Time: 1015 Facilitators: Melody Cirrincione-McCall, LRT,CTRS Location: [address] Dayroom   Group Topic: Communication, Problem Solving   Goal Area(s) Addresses:  Patient will effectively listen to complete activity.  Patient will identify communication skills used to make activity successful.  Patient will identify how skills used during activity can be used to reach post d/c goals.    Behavioral Response: Active   Intervention: Building surveyor Activity - Geometric pattern cards, pencils, blank paper    Activity: Geometric Drawings.  Three volunteers from the peer group will be shown an abstract picture with a particular arrangement of geometrical shapes.  Each round, one 'speaker' will describe the pattern, as accurately as possible without revealing the image to the group.  The remaining group members will listen and draw the picture to reflect how it is described to them. Patients with the role of 'listener' cannot ask clarifying questions but, may request that the speaker repeat a direction. Once the drawings are complete, the presenter will show the rest of the group the picture and compare how close each person came to drawing the picture. LRT will facilitate a post-activity discussion regarding effective communication and the importance of planning, listening, and asking for clarification in daily interactions with others.  Education: Healthy communication, Active listening, Support systems, Discharge planning   Affect/Mood: Appropriate   Participation Level: Active   Participation Quality: Independent   Behavior: Appropriate and Attentive    Speech/Thought Process: Focused   Insight: Good   Judgement: Good   Modes of Intervention: Cooperative Play   Patient Response to Interventions:  Attentive   Education Outcome:  In group clarification offered    Clinical  Observations/Individualized Feedback: Pt was bright, engaged and attentive at the beginning. Pt attempted to complete the activity but eventually tapped out. Pt was jovial and was laughing at different points throughout group but not in a disruptive way. Pt was appropriate during group.     Plan: Continue to engage patient in RT group sessions 2-3x/week.   Carrol Bondar-McCall, LRT,CTRS 12/24/2023 12:11 PM

## 2023-12-24 NOTE — Progress Notes (Signed)
   12/24/23 0815  Psych Admission Type (Psych Patients Only)  Admission Status Voluntary  Psychosocial Assessment  Patient Complaints Nervousness;Worrying  Eye Contact Fair  Facial Expression Worried  Affect Anxious  Speech Logical/coherent  Interaction Assertive  Motor Activity Other (Comment) (WDL)  Appearance/Hygiene Unremarkable  Behavior Characteristics Anxious  Mood Anxious  Aggressive Behavior  Effect No apparent injury  Thought Process  Coherency WDL  Content WDL  Delusions None reported or observed  Perception WDL  Hallucination None reported or observed  Judgment Impaired  Confusion None  Danger to Self  Current suicidal ideation? Denies  Agreement Not to Harm Self Yes  Description of Agreement Verbal  Danger to Others  Danger to Others None reported or observed

## 2023-12-24 NOTE — Progress Notes (Signed)
 [facility] Inpatient Psychiatry Progress Note  Date: 12/24/2023 Patient: Caitlin Brown MRN: [id number]  OLA RAAP is a 43 y.o. female  with a past psychiatric history of MDD and alcohol use disorder, severe. Patient initially arrived to [facility] on 10/17 for SI in the context of alcohol abuse, and admitted to [facility] on 10/22 for crisis stabalization.  No significant past medical history.   Assessment and Plan:  Today's presentation reflects increasing anxiety, irritability, and emotional dysregulation as her withdrawal process continues. While her elevated blood pressure and restlessness are consistent with protracted alcohol withdrawal, her sudden affective shift, difficulty accepting feedback, and externalization of blame suggest the presence of an underlying personality disorder, possibly within the Cluster B spectrum (narcissistic or borderline traits).  She continues to deny SI and HI, and there is no evidence of psychosis or mania. However, her limited insight, intermittent defensiveness, and inability to tolerate perceived loss of control warrant continued observation and supportive interventions.  Propranolol 10 mg BID will be continued for blood pressure and anxiety management. The team will continue to monitor vital signs, withdrawal symptoms, and behavioral responses closely. Psychoeducation will be reinforced regarding the course of withdrawal and the need for medical stabilization prior to discharge. We will hold Zoloft  at 100 mg as this titration may be contributing to affect instability.   # MDD, recurrent severe, anxiety - Continue Zoloft  100 mg daily at bedtime for depression -- Continue propanolol 10 mg twice daily for elevated blood pressure (160s/100s) and continued anxiety, will monitor vitals to consider evaluation for primary hypertension vs withdrawal-related hypertension and use a longer-acting antihypertensive. 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 Ongoing monitoring, motivational interviewing, and careful discharge planning with linkage to a structured CDIOP are essential to mitigate relapse and safety risks.  Discharge Planning: Barriers to Discharge: Medication management Estimated Length of Stay: 4-5 days Predicted Discharge Location: [facility] vs Home with CDIOP  Interval History: Chart and nursing notes reviewed.  Since admission two days ago, the patient has been pleasant, cooperative, and appropriate with staff and peers. She has engaged in group activities and has not exhibited behavioral concerns until today's encounter.  During interview, the patient was initially pleasant and conversant. However, upon being informed that discharge would likely occur Monday at the earliest, she became notably agitated and anxious, expressing frustration with the discharge timeline. She requested to continue the interview privately in her room. Once in her room, her affect and demeanor shifted markedly--she became dismissive and defensive, demonstrating limited insight and poor frustration tolerance.  She expressed strong disagreement with the medical explanation for her elevated blood pressure, insisting that it was solely due to being "stuck on the unit," and rejecting the team's discussion that withdrawal symptoms may persist up to 10 days following an alcohol binge. Her statements became increasingly self-referential ("I know my body better than anyone") and she was minimally receptive to reassurance or education at that point. Her blood  pressures have remained elevated since admission, though have slightly improved since starting propranolol 10 mg BID, which has also modestly reduced her anxiety.  Physical Examination:  Vitals and nursing note reviewed  Musculoskeletal: Normal gait and station Strength & Muscle Tone: within normal limits Gait & Station: normal  Mental Status Exam:  Appearance:Appears stated age, dressed appropriately, with good grooming and hygiene.  Behavior: Initially pleasant and cooperative, but became increasingly anxious and irritable when discussing discharge. Mild psychomotor agitation noted (fidgeting, shifting in seat)  Attitude: Pleasant and polite  Speech: Normal rate and rate. Volume increased as discussion progressed. Speech is coherent and goal-directed.  Mood: "I'm just frustrated."  Affect: Labile  Thought Process: Focused on frustration about being hospitalized  Thought Content: No evidence of delusions, paranoia, or obsessions.   SI/HI: Denies  Perceptions: Denies auditory or visual hallucinations; no perceptual disturbances observed.  Judgement: Poor  Insight: Poor  Fund of Knowledge: WNL     Lab Results:  No visits with results within 1 Day(s) from this visit.  Latest known visit with results is:  Admission on 12/17/2023, Discharged on 12/21/2023  Component Date Value Ref Range Status   WBC 12/17/2023 4.7  4.0 - 10.5 K/uL Final   RBC 12/17/2023 4.07  3.87 - 5.11 MIL/uL Final   Hemoglobin 12/17/2023 12.8  12.0 - 15.0 g/dL Final   HCT 89/82/7974 38.5  36.0 - 46.0 % Final   MCV 12/17/2023 94.6  80.0 - 100.0 fL Final   MCH 12/17/2023 31.4  26.0 - 34.0 pg Final   MCHC 12/17/2023 33.2  30.0 - 36.0 g/dL Final   RDW 89/82/7974 17.6 (H)  11.5 - 15.5 % Final   Platelets 12/17/2023 234  150 - 400 K/uL Final   nRBC 12/17/2023 0.0  0.0 - 0.2 % Final   Sodium 12/17/2023 144  135 - 145 mmol/L Final   Potassium 12/17/2023 4.0  3.5 - 5.1 mmol/L Final   Chloride 12/17/2023 105  98 -  111 mmol/L Final   CO2 12/17/2023 23  22 - 32 mmol/L Final   Glucose, Bld 12/17/2023 109 (H)  70 - 99 mg/dL Final   BUN 89/82/7974 8  6 - 20 mg/dL Final   Creatinine, Ser 12/17/2023 0.70  0.44 - 1.00 mg/dL Final   Calcium 89/82/7974 8.3 (L)  8.9 - 10.3 mg/dL Final   Total Protein 89/82/7974 7.4  6.5 - 8.1 g/dL Final   Albumin 89/82/7974 4.1  3.5 - 5.0 g/dL Final   AST 89/82/7974 55 (H)  15 - 41 U/L Final   ALT 12/17/2023 43  0 - 44 U/L Final   Alkaline Phosphatase 12/17/2023 57  38 - 126 U/L Final   Total Bilirubin 12/17/2023 0.3  0.0 - 1.2 mg/dL Final   GFR, Estimated 12/17/2023 >60  >60 mL/min Final   Anion gap 12/17/2023 16 (H)  5 - 15 Final   Magnesium 12/17/2023 1.9  1.7 - 2.4 mg/dL Final   Phosphorus 89/82/7974 3.7  2.5 - 4.6 mg/dL Final   Lipase 89/82/7974 50  11 - 51 U/L Final   Lactic Acid, Venous 12/17/2023 2.3 (HH)  0.5 - 1.9 mmol/L Final   Comment 12/17/2023 NOTIFIED PHYSICIAN   Final   Preg, Serum 12/17/2023 NEGATIVE  NEGATIVE Final   Specimen Source 12/17/2023 URINE, CLEAN CATCH   Final   Color, Urine 12/17/2023 YELLOW  YELLOW Final   APPearance 12/17/2023 HAZY (A)  CLEAR Final   Specific Gravity, Urine 12/17/2023 1.010  1.005 - 1.030 Final   pH 12/17/2023 5.0  5.0 - 8.0 Final   Glucose, UA 12/17/2023 NEGATIVE  NEGATIVE mg/dL Final   Hgb  urine dipstick 12/17/2023 MODERATE (A)  NEGATIVE Final   Bilirubin Urine 12/17/2023 NEGATIVE  NEGATIVE Final   Ketones, ur 12/17/2023 NEGATIVE  NEGATIVE mg/dL Final   Protein, ur 89/82/7974 30 (A)  NEGATIVE mg/dL Final   Nitrite 89/82/7974 POSITIVE (A)  NEGATIVE Final   Leukocytes,Ua 12/17/2023 MODERATE (A)  NEGATIVE Final   RBC / HPF 12/17/2023 0-5  0 - 5 RBC/hpf Final   WBC, UA 12/17/2023 >50  0 - 5 WBC/hpf Final   Bacteria, UA 12/17/2023 MANY (A)  NONE SEEN Final   Squamous Epithelial / HPF 12/17/2023 0-5  0 - 5 /HPF Final   Mucus 12/17/2023 PRESENT   Final   Opiates 12/17/2023 NONE DETECTED  NONE DETECTED Final   Cocaine  12/17/2023 NONE DETECTED  NONE DETECTED Final   Benzodiazepines 12/17/2023 NONE DETECTED  NONE DETECTED Final   Amphetamines 12/17/2023 NONE DETECTED  NONE DETECTED Final   Tetrahydrocannabinol 12/17/2023 POSITIVE (A)  NONE DETECTED Final   Barbiturates 12/17/2023 NONE DETECTED  NONE DETECTED Final   Alcohol, Ethyl (B) 12/17/2023 365 (HH)  <15 mg/dL Final   TSH 89/82/7974 1.299  0.350 - 4.500 uIU/mL Final   SARS Coronavirus 2 by RT PCR 12/17/2023 NEGATIVE  NEGATIVE Final   Influenza A by PCR 12/17/2023 NEGATIVE  NEGATIVE Final   Influenza B by PCR 12/17/2023 NEGATIVE  NEGATIVE Final   Resp Syncytial Virus by PCR 12/17/2023 NEGATIVE  NEGATIVE Final   Specimen Description 12/17/2023 URINE, RANDOM   Final   Special Requests 12/17/2023    Final                   Value:NONE Reflexed from [id number] Performed at [facility], [address] [place], [place] [address]    Culture 12/17/2023 >=100,000 COLONIES/mL ESCHERICHIA COLI (A)   Final   Report Status 12/17/2023 12/19/2023 FINAL   Final   Organism ID, Bacteria 12/17/2023 ESCHERICHIA COLI (A)   Final   Lactic Acid, Venous 12/18/2023 1.6  0.5 - 1.9 mmol/L Final   HIV Screen 4th Generation wRfx 12/18/2023 Non Reactive  Non Reactive Final   WBC 12/18/2023 4.2  4.0 - 10.5 K/uL Final   RBC 12/18/2023 3.77 (L)  3.87 - 5.11 MIL/uL Final   Hemoglobin 12/18/2023 11.8 (L)  12.0 - 15.0 g/dL Final   HCT 89/81/7974 36.0  36.0 - 46.0 % Final   MCV 12/18/2023 95.5  80.0 - 100.0 fL Final   MCH 12/18/2023 31.3  26.0 - 34.0 pg Final   MCHC 12/18/2023 32.8  30.0 - 36.0 g/dL Final   RDW 89/81/7974 17.4 (H)  11.5 - 15.5 % Final   Platelets 12/18/2023 176  150 - 400 K/uL Final   nRBC 12/18/2023 0.0  0.0 - 0.2 % Final   Creatinine, Ser 12/18/2023 0.64  0.44 - 1.00 mg/dL Final   GFR, Estimated 12/18/2023 >60  >60 mL/min Final   Sodium 12/19/2023 136  135 - 145 mmol/L Final   Potassium 12/19/2023 3.6  3.5 - 5.1 mmol/L Final   Chloride 12/19/2023 103  98 -  111 mmol/L Final   CO2 12/19/2023 20 (L)  22 - 32 mmol/L Final   Glucose, Bld 12/19/2023 86  70 - 99 mg/dL Final   BUN 89/80/7974 <5 (L)  6 - 20 mg/dL Final   Creatinine, Ser 12/19/2023 0.71  0.44 - 1.00 mg/dL Final   Calcium 89/80/7974 8.2 (L)  8.9 - 10.3 mg/dL Final   GFR, Estimated 12/19/2023 >60  >60 mL/min Final  Anion gap 12/19/2023 13  5 - 15 Final   WBC 12/20/2023 4.0  4.0 - 10.5 K/uL Final   RBC 12/20/2023 4.20  3.87 - 5.11 MIL/uL Final   Hemoglobin 12/20/2023 13.3  12.0 - 15.0 g/dL Final   HCT 89/79/7974 38.6  36.0 - 46.0 % Final   MCV 12/20/2023 91.9  80.0 - 100.0 fL Final   MCH 12/20/2023 31.7  26.0 - 34.0 pg Final   MCHC 12/20/2023 34.5  30.0 - 36.0 g/dL Final   RDW 89/79/7974 15.8 (H)  11.5 - 15.5 % Final   Platelets 12/20/2023 174  150 - 400 K/uL Final   nRBC 12/20/2023 0.0  0.0 - 0.2 % Final   Sodium 12/20/2023 134 (L)  135 - 145 mmol/L Final   Potassium 12/20/2023 3.5  3.5 - 5.1 mmol/L Final   Chloride 12/20/2023 103  98 - 111 mmol/L Final   CO2 12/20/2023 19 (L)  22 - 32 mmol/L Final   Glucose, Bld 12/20/2023 82  70 - 99 mg/dL Final   BUN 89/79/7974 6  6 - 20 mg/dL Final   Creatinine, Ser 12/20/2023 0.69  0.44 - 1.00 mg/dL Final   Calcium 89/79/7974 8.8 (L)  8.9 - 10.3 mg/dL Final   Total Protein 89/79/7974 7.4  6.5 - 8.1 g/dL Final   Albumin 89/79/7974 4.0  3.5 - 5.0 g/dL Final   AST 89/79/7974 76 (H)  15 - 41 U/L Final   ALT 12/20/2023 65 (H)  0 - 44 U/L Final   Alkaline Phosphatase 12/20/2023 51  38 - 126 U/L Final   Total Bilirubin 12/20/2023 0.8  0.0 - 1.2 mg/dL Final   GFR, Estimated 12/20/2023 >60  >60 mL/min Final   Anion gap 12/20/2023 12  5 - 15 Final   Magnesium 12/20/2023 1.8  1.7 - 2.4 mg/dL Final   Glucose-Capillary 12/19/2023 99  70 - 99 mg/dL Final   Vitamin A-87 89/79/7974 453  180 - 914 pg/mL Final   Folate 12/20/2023 >20.0  >5.9 ng/mL Final   Vitamin B6 12/20/2023 12.0  3.4 - 65.2 ug/L Final   Methylmalonic Acid, Quantitative 12/20/2023 55   0 - 378 nmol/L Final   Homocysteine 12/20/2023 14.1  0.0 - 14.5 umol/L Final   Sodium 12/21/2023 133 (L)  135 - 145 mmol/L Final   Potassium 12/21/2023 3.9  3.5 - 5.1 mmol/L Final   Chloride 12/21/2023 104  98 - 111 mmol/L Final   CO2 12/21/2023 19 (L)  22 - 32 mmol/L Final   Glucose, Bld 12/21/2023 81  70 - 99 mg/dL Final   BUN 89/78/7974 8  6 - 20 mg/dL Final   Creatinine, Ser 12/21/2023 0.85  0.44 - 1.00 mg/dL Final   Calcium 89/78/7974 9.0  8.9 - 10.3 mg/dL Final   Total Protein 89/78/7974 7.6  6.5 - 8.1 g/dL Final   Albumin 89/78/7974 3.9  3.5 - 5.0 g/dL Final   AST 89/78/7974 80 (H)  15 - 41 U/L Final   ALT 12/21/2023 76 (H)  0 - 44 U/L Final   Alkaline Phosphatase 12/21/2023 56  38 - 126 U/L Final   Total Bilirubin 12/21/2023 1.0  0.0 - 1.2 mg/dL Final   GFR, Estimated 12/21/2023 >60  >60 mL/min Final   Anion gap 12/21/2023 10  5 - 15 Final     Vitals: Blood pressure (!) 135/101, pulse 88, temperature (!) 97.4 F (36.3 C), temperature source Oral, resp. rate 20, height 5' 6 (1.676 m), weight 66.2 kg,  last menstrual period 12/14/2023, SpO2 94%.   NB: This note was created using a voice recognition software as a result there may be grammatical errors inadvertently enclosed that do not reflect the nature of this encounter. Every attempt is made to correct such errors.   Alan Maiden, MD PGY-1, Psychiatry Residency  12/24/2023, 2:53 PM

## 2023-12-24 NOTE — Group Note (Signed)
 Date:  12/24/2023 Time:  8:33 PM  Group Topic/Focus:  Wrap-Up Group:   The focus of this group is to help patients review their daily goal of treatment and discuss progress on daily workbooks.    Participation Level:  None  Participation Quality:  Resistant  Affect:  Flat  Cognitive:  Lacking  Insight: Lacking  Engagement in Group:  None  Modes of Intervention:  Discussion  Additional Comments:  Patient attended group but did not participate  Bari Moats 12/24/2023, 8:33 PM

## 2023-12-24 NOTE — Group Note (Signed)
 Date:  12/24/2023 Time:  5:33 PM  Group Topic/Focus: Stress Relief Education Overcoming Stress:   The focus of this group is to define stress and help patients assess their triggers.  Objectives: Identify Personal Stress Triggers and early warning signs.  Learn and practice 3 healthy stress reduction techniques.  Build peer support and normalize stress experiences.  Outcome: Patient interacted appropriately with group education and peers.       Participation Level:  Did Not Attend  Participation Quality:    Affect:    Cognitive:    Insight:   Engagement in Group:    Modes of Intervention:    Additional Comments:    Caitlin Brown 12/24/2023, 5:33 PM

## 2023-12-24 NOTE — Group Note (Signed)
 Date:  12/24/2023 Time:  7:11 PM  Group Topic/Focus:  Bingo - Positive Socialization  The group engaged in a [employer] designed to encourage positive socialization, promote a supportive environment, and enhance cognitive skills. Patients participated actively, demonstrating good communication and cooperation with peers. The activity was structured to create a relaxed, enjoyable atmosphere, fostering a sense of community among participants.  Participation Level:  Active  Participation Quality:  Appropriate, Attentive, and Supportive  Affect:  Appropriate  Cognitive:  Alert and Appropriate  Insight: Appropriate and Improving  Engagement in Group:  Engaged, Improving, and Supportive  Modes of Intervention:  Activity, Problem-solving, and Socialization  Additional Comments:  Caitlin Brown attended and participated in bingo. Winner x2!  Caitlin Brown Caitlin Brown Caitlin Brown 12/24/2023, 7:11 PM

## 2023-12-24 NOTE — Group Note (Signed)
 Date:  12/24/2023 Time:  6:38 PM  Group Topic/Focus:  Exploring Control Through Creative Expression  The focus of this group is to encourage insight into personal experiences of control and flexibility, promote creative expression, and enhance group cohesion through shared activity and reflection.  Patients participated in a structured art-based group activity focused on the theme of control. Seated in a circle in the dayroom, each patient received a blank sheet of paper and was instructed to draw anything that came to mind without including their name. Every two minutes, papers were passed to the right, allowing each participant to add to or alter another person's drawing.  At the conclusion of the exercise, patients reflected on their emotional experiences during the process--particularly feelings associated with gaining, losing, and sharing control. The group discussed how control can shift due to external circumstances or the actions of others and explored ways to view these changes in a positive and adaptive manner. Patients shared their final collaborative drawings with the group.    Participation Level:  Active  Participation Quality:  Appropriate and Attentive  Affect:  Appropriate  Cognitive:  Appropriate  Insight: Appropriate and Improving  Engagement in Group:  Engaged and Improving  Modes of Intervention:  Activity, Exploration, and Socialization  Additional Comments:  Angelia attended and actively participated in this wellness group. Throughout the activity, she remained engaged and courteous and supportive toward her peers.  [name] Myron Lona 12/24/2023, 6:38 PM

## 2023-12-24 NOTE — Group Note (Signed)
 Date:  12/24/2023 Time:  7:00 PM  Group Topic/Focus:  Bingo - Positive Socialization  The group engaged in a [employer] designed to encourage positive socialization, promote a supportive environment, and enhance cognitive skills. Patients participated actively, demonstrating good communication and cooperation with peers. The activity was structured to create a relaxed, enjoyable atmosphere, fostering a sense of community among participants.  Participation Level:  Active  Participation Quality:  Appropriate, Attentive, and Supportive  Affect:  Appropriate  Cognitive:  Alert and Appropriate  Insight: Appropriate and Improving  Engagement in Group:  Engaged, Improving, and Supportive  Modes of Intervention:  Activity, Problem-solving, and Socialization  Additional Comments:  Caitlin Brown attended and participated in bingo. Winner x2!  [name] Caitlin Brown 12/24/2023, 7:00 PM

## 2023-12-24 NOTE — Group Note (Signed)
 Date:  12/24/2023 Time:  10:08 AM  Group Topic/Focus:  Goals Group:   The focus of this group is to help patients establish daily goals to achieve during treatment and discuss how the patient can incorporate goal setting into their daily lives to aide in recovery.    Participation Level:  Minimal  Participation Quality:  Appropriate and Inattentive  Affect:  Appropriate  Cognitive:  Appropriate  Insight: Appropriate and Improving  Engagement in Group:  Improving and Lacking  Modes of Intervention:  Discussion, Education, Socialization, and Support  Additional Comments:  Patient attended goals group with minimal participation, Tierre shares her goal for he day is to speak with her doctors and develop her discharge plan.  [name] Shakiyah Cirilo 12/24/2023, 10:08 AM

## 2023-12-25 DIAGNOSIS — F10231 Alcohol dependence with withdrawal delirium: Secondary | ICD-10-CM

## 2023-12-25 NOTE — Progress Notes (Signed)
   12/25/23 1950  Psych Admission Type (Psych Patients Only)  Admission Status Voluntary  Psychosocial Assessment  Patient Complaints None  Eye Contact Fair  Facial Expression Animated  Affect Appropriate to circumstance  Speech Logical/coherent  Interaction Assertive  Motor Activity Other (Comment) (wnl)  Appearance/Hygiene Unremarkable  Behavior Characteristics Appropriate to situation  Mood Pleasant;Anxious  Thought Process  Coherency WDL  Content WDL  Delusions None reported or observed  Perception WDL  Hallucination None reported or observed  Judgment Poor  Confusion None  Danger to Self  Current suicidal ideation? Denies

## 2023-12-25 NOTE — Group Note (Signed)
 Date:  12/25/2023 Time:  10:05 AM  Group Topic/Focus:  Goals Group:   The focus of this group is to help patients establish daily goals to achieve during treatment and discuss how the patient can incorporate goal setting into their daily lives to aide in recovery. Orientation:   The focus of this group is to educate the patient on the purpose and policies of crisis stabilization and provide a format to answer questions about their admission.  The group details unit policies and expectations of patients while admitted.    Participation Level:  Active  Participation Quality:  Appropriate, Sharing, and Supportive  Affect:  Appropriate  Cognitive:  Appropriate  Insight: Appropriate  Engagement in Group:  Engaged and Supportive  Modes of Intervention:  Discussion  Additional Comments:    Yahshua Thibault R Shian Goodnow 12/25/2023, 10:05 AM

## 2023-12-25 NOTE — Progress Notes (Signed)
   12/25/23 0900  Psych Admission Type (Psych Patients Only)  Admission Status Voluntary  Psychosocial Assessment  Patient Complaints Sleep disturbance  Eye Contact Fair  Facial Expression Animated;Anxious  Affect Appropriate to circumstance;Anxious  Speech Logical/coherent  Interaction Assertive  Motor Activity Other (Comment) (WNL)  Appearance/Hygiene Unremarkable  Behavior Characteristics Appropriate to situation  Mood Anxious;Pleasant  Thought Process  Coherency WDL  Content WDL  Delusions None reported or observed  Perception WDL  Hallucination None reported or observed  Judgment Poor  Confusion None  Danger to Self  Current suicidal ideation? Denies  Agreement Not to Harm Self Yes  Description of Agreement verbal  Danger to Others  Danger to Others None reported or observed

## 2023-12-25 NOTE — Plan of Care (Signed)
  Problem: Education: Goal: Emotional status will improve Outcome: Progressing Goal: Verbalization of understanding the information provided will improve Outcome: Progressing   Problem: Activity: Goal: Interest or engagement in activities will improve Outcome: Progressing Goal: Sleeping patterns will improve Outcome: Progressing

## 2023-12-25 NOTE — Hospital Course (Signed)
 During the patient's hospitalization, patient had extensive initial psychiatric evaluation, and follow-up psychiatric evaluations every day.  Psychiatric diagnoses provided upon initial assessment: MDD, severe, recurrent, Alcohol use disorder  Patient's psychiatric medications were adjusted on admission: Increase home dose of Zoloft  from 50 mg to 100 mg daily for depression  During the hospitalization, other adjustments were made to the patient's psychiatric medication regimen: Added propanolol 10 mg BID for continued elevated BP and anxiety.   Patient's care was discussed during the interdisciplinary team meeting every day during the hospitalization.  The patient denied having side effects to prescribed psychiatric medication.  Gradually, patient started adjusting to milieu. The patient was evaluated each day by a clinical provider to ascertain response to treatment. Improvement was noted by the patient's report of decreasing symptoms, improved sleep and appetite, affect, medication tolerance, behavior, and participation in unit programming.  Patient was asked each day to complete a self inventory noting mood, mental status, pain, new symptoms, anxiety and concerns.   Symptoms were reported as significantly decreased or resolved completely by discharge.  The patient reports that their mood is stable.  The patient denied having suicidal thoughts for more than 48 hours prior to discharge.  Patient denies having homicidal thoughts.  Patient denies having auditory hallucinations.  Patient denies any visual hallucinations or other symptoms of psychosis.  The patient was motivated to continue taking medication with a goal of continued improvement in mental health.   The patient reports their target psychiatric symptoms of depression responded well to the psychiatric medications, and the patient reports overall benefit other psychiatric hospitalization. Supportive psychotherapy was provided to the  patient. The patient also participated in regular group therapy while hospitalized. Coping skills, problem solving as well as relaxation therapies were also part of the unit programming.  Labs were reviewed with the patient, and abnormal results were discussed with the patient.  The patient is able to verbalize their individual safety plan to this provider.  # It is recommended to the patient to continue psychiatric medications as prescribed, after discharge from the hospital.    # It is recommended to the patient to follow up with your outpatient psychiatric provider and PCP.  # It was discussed with the patient, the impact of alcohol, drugs, tobacco have been there overall psychiatric and medical wellbeing, and total abstinence from substance use was recommended the patient.ed.  # Prescriptions provided or sent directly to preferred pharmacy at discharge. Patient agreeable to plan. Given opportunity to ask questions. Appears to feel comfortable with discharge.    # In the event of worsening symptoms, the patient is instructed to call the crisis hotline, 911 and or go to the nearest ED for appropriate evaluation and treatment of symptoms. To follow-up with primary care provider for other medical issues, concerns and or health care needs  # Patient was discharged *** with a plan to follow up as noted below.    On day of discharge ***

## 2023-12-25 NOTE — Progress Notes (Signed)
 Shift Note  (Sleep Hours) - 8.5  (Any PRNs that were needed, meds refused, or side effects to meds)- PRN Hydroxyzine  and Trazodone   (Any disturbances and when (visitation, over night)-None  (Concerns raised by the patient)- Patient stated she has having trouble falling asleep every night.  However, MHT verbalizes patient went to bed at appropriate time.   (SI/HI/AVH)- Denies

## 2023-12-25 NOTE — Plan of Care (Signed)
   Problem: Education: Goal: Emotional status will improve Outcome: Progressing Goal: Mental status will improve Outcome: Progressing Goal: Verbalization of understanding the information provided will improve Outcome: Progressing   Problem: Activity: Goal: Interest or engagement in activities will improve Outcome: Progressing

## 2023-12-25 NOTE — Group Note (Signed)
 Date:  12/25/2023 Time:  12:51 PM  Group Topic/Focus: Social Work   Pt did attend social work group   Sherril Heyward R Danon Lograsso 12/25/2023, 12:51 PM

## 2023-12-25 NOTE — Group Note (Signed)
 Date:  12/25/2023 Time:  5:51 PM  Group Topic/Focus:  Healthy Communication:   The focus of this group is to discuss communication, barriers to communication, as well as healthy ways to communicate with others. Went over Chapter 5 in facility group book covering the five love languages.    Participation Level:  Active  Participation Quality:  Appropriate  Affect:  Appropriate  Cognitive:  Appropriate  Insight: Appropriate  Engagement in Group:  Engaged  Modes of Intervention:  Discussion and Education  Additional Comments:    Juliene CHRISTELLA Huddle 12/25/2023, 5:51 PM

## 2023-12-25 NOTE — Group Note (Signed)
 Date:  12/25/2023 Time:  10:41 AM  Group Topic/Focus:   Social Wellness: To foster a sense of well-being, connection, and personal growth within a supportive community. Such groups focus on improving various aspects of mental, emotional, and social health while encouraging positive interactions and self-awareness. Anger Iceberg: To help participants gain insight into the complex nature of anger and explore the deeper, often hidden emotions that lie beneath it. To understand how emotions can be presented as anger towards others.   Participation Level:  Active  Participation Quality:  Appropriate, Sharing, and Supportive  Affect:  Appropriate  Cognitive:  Appropriate  Insight: Appropriate  Engagement in Group:  Engaged and Supportive  Modes of Intervention:  Activity and Discussion  Additional Comments:    Shawnn Bouillon R Evlyn Amason 12/25/2023, 10:41 AM

## 2023-12-25 NOTE — Group Note (Signed)
 Date:  12/25/2023 Time:  10:15 PM  Group Topic/Focus:  Wrap-Up Group:   The focus of this group is to help patients review their daily goal of treatment and discuss progress on daily workbooks.    Participation Level:  Active  Participation Quality:  Appropriate and Attentive  Affect:  Appropriate  Cognitive:  Alert and Appropriate  Insight: Appropriate and Good  Engagement in Group:  Engaged  Modes of Intervention:  Discussion and Education  Additional Comments:  Pt attended and participated in wrap up group this evening and rated their day a 7/10. Pt stated that they had an uneventful chill day. Pt stated that their medications are helping them to complete their goal, which is to control their anxiety. Pt has no concerns to report at this time.   Caitlin Brown 12/25/2023, 10:15 PM

## 2023-12-25 NOTE — Progress Notes (Addendum)
 [facility] Inpatient Psychiatry Progress Note  Date: 12/25/2023 Patient: Caitlin Brown MRN: [id number]  Caitlin Brown is a 43 y.o. female  with a past psychiatric history of MDD and alcohol use disorder, severe. Patient initially arrived to [facility] on 10/17 for SI in the context of alcohol abuse, and admitted to [facility] on 10/22 for crisis stabalization.  No significant past medical history.   Assessment and Plan:  Patient is peasant and cooperative on interview today. She continues to deny SI and HI, and there is no evidence of psychosis or mania. She plans to attend virtual [facility] for alcohol use disorder 3x/week for a few months once she is discharged.   Propranolol 10 mg BID will be continued for blood pressure control and anxiety management. The team will continue to monitor vital signs, withdrawal symptoms (last several CIWAs between 0-1, will continue through discharge) and behavioral responses closely. Psychoeducation was reinforced regarding the course of withdrawal and the need for medical stabilization prior to discharge. We will continue Zoloft  at 100 mg, as she is tolerating this medication well and showing improvement in overall mood.   # MDD, recurrent severe, anxiety - Continue Zoloft  100 mg daily at bedtime for depression -- Continue propanolol 10 mg twice daily for elevated blood pressure (previously 160s/100s, now normalized to 130s/80s) and continued anxiety, pulse at time of blood pressure was 86   # Alcohol withdrawal/ Alcohol Use Disorder - Per stepmother, patient recently tried Antabuse and was not compliant due to distressing sickness with drinking - CIWA continued through discharge due to history of severe alcohol use with DTs and seizures, however scores are reassuring  Risk Assessment: Risk is mild at this time. Ongoing monitoring, motivational interviewing, and careful discharge planning with linkage to a structured CDIOP or  [facility] are essential to mitigate relapse and safety risks.  Discharge Planning: Barriers to Discharge: Medication management Estimated Length of Stay: 1 day Predicted Discharge Location: Home with virtual [facility] vs Home with CDIOP  Interval History: Chart and nursing notes reviewed.  Since admission, patient has been pleasant, cooperative, and appropriate with staff and peers. She has engaged in group activities and has not exhibited behavioral concerns today.  During interview, the patient was pleasant and conversant. She expresses her desire to return home to see her children, and plans to attend virtual [facility] for her alcohol use disorder. We discussed possibly initiating Antabuse, however patient says she is unsure of this and does not think she would like to start this. She expressed interest in either [facility], IOP or going back to naltrexone. She said she had previously been on naltrexone for 1 year, and says it might take a while for it to be effective. We discussed if it hadn't been effective during the year she was taking this, then this is not an effective medication for her and it may be best to pursue other treatment options for AUD.  She is glad her BPs have regulated with propranolol. She says she can feel it immediately when she takes this, and we discussed this is because we have the IR formulation in the hospital, and if she finds this helpful for her anxiety she can discuss switching to ER formulation with her outpatient PCP.   Physical Examination:  Vitals and nursing note reviewed ROS: denies constipation, diarrhea, HA, n/v, SI/HI/AVH  Musculoskeletal: Normal gait and station Strength & Muscle Tone: within normal limits Gait & Station: normal  Mental Status  Exam:  Appearance: Appears stated age, dressed appropriately, with good grooming and hygiene.  Behavior: pleasant and cooperative, mild psychomotor agitation noted (fidgeting,  twitching)  Attitude: Pleasant and polite  Speech: Normal rate and rate. Speech is coherent and goal-directed.  Mood: "good"  Affect: Congruent  Thought Process: Linear, goal-directed  Thought Content: No evidence of delusions, paranoia, or obsessions.   SI/HI: Denies  Perceptions: Denies AVH; no perceptual disturbances observed.  Judgement: Fair  Insight: Fair  Fund of Knowledge: WNL    Lab Results:  No visits with results within 1 Day(s) from this visit.  Latest known visit with results is:  Admission on 12/17/2023, Discharged on 12/21/2023  Component Date Value Ref Range Status   WBC 12/17/2023 4.7  4.0 - 10.5 K/uL Final   RBC 12/17/2023 4.07  3.87 - 5.11 MIL/uL Final   Hemoglobin 12/17/2023 12.8  12.0 - 15.0 g/dL Final   HCT 89/82/7974 38.5  36.0 - 46.0 % Final   MCV 12/17/2023 94.6  80.0 - 100.0 fL Final   MCH 12/17/2023 31.4  26.0 - 34.0 pg Final   MCHC 12/17/2023 33.2  30.0 - 36.0 g/dL Final   RDW 89/82/7974 17.6 (H)  11.5 - 15.5 % Final   Platelets 12/17/2023 234  150 - 400 K/uL Final   nRBC 12/17/2023 0.0  0.0 - 0.2 % Final   Sodium 12/17/2023 144  135 - 145 mmol/L Final   Potassium 12/17/2023 4.0  3.5 - 5.1 mmol/L Final   Chloride 12/17/2023 105  98 - 111 mmol/L Final   CO2 12/17/2023 23  22 - 32 mmol/L Final   Glucose, Bld 12/17/2023 109 (H)  70 - 99 mg/dL Final   BUN 89/82/7974 8  6 - 20 mg/dL Final   Creatinine, Ser 12/17/2023 0.70  0.44 - 1.00 mg/dL Final   Calcium 89/82/7974 8.3 (L)  8.9 - 10.3 mg/dL Final   Total Protein 89/82/7974 7.4  6.5 - 8.1 g/dL Final   Albumin 89/82/7974 4.1  3.5 - 5.0 g/dL Final   AST 89/82/7974 55 (H)  15 - 41 U/L Final   ALT 12/17/2023 43  0 - 44 U/L Final   Alkaline Phosphatase 12/17/2023 57  38 - 126 U/L Final   Total Bilirubin 12/17/2023 0.3  0.0 - 1.2 mg/dL Final   GFR, Estimated 12/17/2023 >60  >60 mL/min Final   Anion gap 12/17/2023 16 (H)  5 - 15 Final   Magnesium 12/17/2023 1.9  1.7 - 2.4 mg/dL Final   Phosphorus  89/82/7974 3.7  2.5 - 4.6 mg/dL Final   Lipase 89/82/7974 50  11 - 51 U/L Final   Lactic Acid, Venous 12/17/2023 2.3 (HH)  0.5 - 1.9 mmol/L Final   Comment 12/17/2023 NOTIFIED PHYSICIAN   Final   Preg, Serum 12/17/2023 NEGATIVE  NEGATIVE Final   Specimen Source 12/17/2023 URINE, CLEAN CATCH   Final   Color, Urine 12/17/2023 YELLOW  YELLOW Final   APPearance 12/17/2023 HAZY (A)  CLEAR Final   Specific Gravity, Urine 12/17/2023 1.010  1.005 - 1.030 Final   pH 12/17/2023 5.0  5.0 - 8.0 Final   Glucose, UA 12/17/2023 NEGATIVE  NEGATIVE mg/dL Final   Hgb urine dipstick 12/17/2023 MODERATE (A)  NEGATIVE Final   Bilirubin Urine 12/17/2023 NEGATIVE  NEGATIVE Final   Ketones, ur 12/17/2023 NEGATIVE  NEGATIVE mg/dL Final   Protein, ur 89/82/7974 30 (A)  NEGATIVE mg/dL Final   Nitrite 89/82/7974 POSITIVE (A)  NEGATIVE Final   Leukocytes,Ua 12/17/2023 MODERATE (A)  NEGATIVE Final   RBC / HPF 12/17/2023 0-5  0 - 5 RBC/hpf Final   WBC, UA 12/17/2023 >50  0 - 5 WBC/hpf Final   Bacteria, UA 12/17/2023 MANY (A)  NONE SEEN Final   Squamous Epithelial / HPF 12/17/2023 0-5  0 - 5 /HPF Final   Mucus 12/17/2023 PRESENT   Final   Opiates 12/17/2023 NONE DETECTED  NONE DETECTED Final   Cocaine 12/17/2023 NONE DETECTED  NONE DETECTED Final   Benzodiazepines 12/17/2023 NONE DETECTED  NONE DETECTED Final   Amphetamines 12/17/2023 NONE DETECTED  NONE DETECTED Final   Tetrahydrocannabinol 12/17/2023 POSITIVE (A)  NONE DETECTED Final   Barbiturates 12/17/2023 NONE DETECTED  NONE DETECTED Final   Alcohol, Ethyl (B) 12/17/2023 365 (HH)  <15 mg/dL Final   TSH 89/82/7974 1.299  0.350 - 4.500 uIU/mL Final   SARS Coronavirus 2 by RT PCR 12/17/2023 NEGATIVE  NEGATIVE Final   Influenza A by PCR 12/17/2023 NEGATIVE  NEGATIVE Final   Influenza B by PCR 12/17/2023 NEGATIVE  NEGATIVE Final   Resp Syncytial Virus by PCR 12/17/2023 NEGATIVE  NEGATIVE Final   Specimen Description 12/17/2023 URINE, RANDOM   Final   Special  Requests 12/17/2023    Final                   Value:NONE Reflexed from [id number] Performed at [facility], [address], [place], [place] [address]    Culture 12/17/2023 >=100,000 COLONIES/mL ESCHERICHIA COLI (A)   Final   Report Status 12/17/2023 12/19/2023 FINAL   Final   Organism ID, Bacteria 12/17/2023 ESCHERICHIA COLI (A)   Final   Lactic Acid, Venous 12/18/2023 1.6  0.5 - 1.9 mmol/L Final   HIV Screen 4th Generation wRfx 12/18/2023 Non Reactive  Non Reactive Final   WBC 12/18/2023 4.2  4.0 - 10.5 K/uL Final   RBC 12/18/2023 3.77 (L)  3.87 - 5.11 MIL/uL Final   Hemoglobin 12/18/2023 11.8 (L)  12.0 - 15.0 g/dL Final   HCT 89/81/7974 36.0  36.0 - 46.0 % Final   MCV 12/18/2023 95.5  80.0 - 100.0 fL Final   MCH 12/18/2023 31.3  26.0 - 34.0 pg Final   MCHC 12/18/2023 32.8  30.0 - 36.0 g/dL Final   RDW 89/81/7974 17.4 (H)  11.5 - 15.5 % Final   Platelets 12/18/2023 176  150 - 400 K/uL Final   nRBC 12/18/2023 0.0  0.0 - 0.2 % Final   Creatinine, Ser 12/18/2023 0.64  0.44 - 1.00 mg/dL Final   GFR, Estimated 12/18/2023 >60  >60 mL/min Final   Sodium 12/19/2023 136  135 - 145 mmol/L Final   Potassium 12/19/2023 3.6  3.5 - 5.1 mmol/L Final   Chloride 12/19/2023 103  98 - 111 mmol/L Final   CO2 12/19/2023 20 (L)  22 - 32 mmol/L Final   Glucose, Bld 12/19/2023 86  70 - 99 mg/dL Final   BUN 89/80/7974 <5 (L)  6 - 20 mg/dL Final   Creatinine, Ser 12/19/2023 0.71  0.44 - 1.00 mg/dL Final   Calcium 89/80/7974 8.2 (L)  8.9 - 10.3 mg/dL Final   GFR, Estimated 12/19/2023 >60  >60 mL/min Final   Anion gap 12/19/2023 13  5 - 15 Final   WBC 12/20/2023 4.0  4.0 - 10.5 K/uL Final   RBC 12/20/2023 4.20  3.87 - 5.11 MIL/uL Final   Hemoglobin 12/20/2023 13.3  12.0 - 15.0 g/dL Final   HCT 89/79/7974 38.6  36.0 - 46.0 % Final  MCV 12/20/2023 91.9  80.0 - 100.0 fL Final   MCH 12/20/2023 31.7  26.0 - 34.0 pg Final   MCHC 12/20/2023 34.5  30.0 - 36.0 g/dL Final   RDW 89/79/7974 15.8 (H)  11.5 -  15.5 % Final   Platelets 12/20/2023 174  150 - 400 K/uL Final   nRBC 12/20/2023 0.0  0.0 - 0.2 % Final   Sodium 12/20/2023 134 (L)  135 - 145 mmol/L Final   Potassium 12/20/2023 3.5  3.5 - 5.1 mmol/L Final   Chloride 12/20/2023 103  98 - 111 mmol/L Final   CO2 12/20/2023 19 (L)  22 - 32 mmol/L Final   Glucose, Bld 12/20/2023 82  70 - 99 mg/dL Final   BUN 89/79/7974 6  6 - 20 mg/dL Final   Creatinine, Ser 12/20/2023 0.69  0.44 - 1.00 mg/dL Final   Calcium 89/79/7974 8.8 (L)  8.9 - 10.3 mg/dL Final   Total Protein 89/79/7974 7.4  6.5 - 8.1 g/dL Final   Albumin 89/79/7974 4.0  3.5 - 5.0 g/dL Final   AST 89/79/7974 76 (H)  15 - 41 U/L Final   ALT 12/20/2023 65 (H)  0 - 44 U/L Final   Alkaline Phosphatase 12/20/2023 51  38 - 126 U/L Final   Total Bilirubin 12/20/2023 0.8  0.0 - 1.2 mg/dL Final   GFR, Estimated 12/20/2023 >60  >60 mL/min Final   Anion gap 12/20/2023 12  5 - 15 Final   Magnesium 12/20/2023 1.8  1.7 - 2.4 mg/dL Final   Glucose-Capillary 12/19/2023 99  70 - 99 mg/dL Final   Vitamin A-87 89/79/7974 453  180 - 914 pg/mL Final   Folate 12/20/2023 >20.0  >5.9 ng/mL Final   Vitamin B6 12/20/2023 12.0  3.4 - 65.2 ug/L Final   Methylmalonic Acid, Quantitative 12/20/2023 55  0 - 378 nmol/L Final   Homocysteine 12/20/2023 14.1  0.0 - 14.5 umol/L Final   Sodium 12/21/2023 133 (L)  135 - 145 mmol/L Final   Potassium 12/21/2023 3.9  3.5 - 5.1 mmol/L Final   Chloride 12/21/2023 104  98 - 111 mmol/L Final   CO2 12/21/2023 19 (L)  22 - 32 mmol/L Final   Glucose, Bld 12/21/2023 81  70 - 99 mg/dL Final   BUN 89/78/7974 8  6 - 20 mg/dL Final   Creatinine, Ser 12/21/2023 0.85  0.44 - 1.00 mg/dL Final   Calcium 89/78/7974 9.0  8.9 - 10.3 mg/dL Final   Total Protein 89/78/7974 7.6  6.5 - 8.1 g/dL Final   Albumin 89/78/7974 3.9  3.5 - 5.0 g/dL Final   AST 89/78/7974 80 (H)  15 - 41 U/L Final   ALT 12/21/2023 76 (H)  0 - 44 U/L Final   Alkaline Phosphatase 12/21/2023 56  38 - 126 U/L Final    Total Bilirubin 12/21/2023 1.0  0.0 - 1.2 mg/dL Final   GFR, Estimated 12/21/2023 >60  >60 mL/min Final   Anion gap 12/21/2023 10  5 - 15 Final     Vitals: Blood pressure 130/85, pulse 86, temperature 98.2 F (36.8 C), temperature source Oral, resp. rate 16, height 5' 6 (1.676 m), weight 66.2 kg, last menstrual period 12/14/2023, SpO2 100%.   NB: This note was created using a voice recognition software as a result there may be grammatical errors inadvertently enclosed that do not reflect the nature of this encounter. Every attempt is made to correct such errors.   Alfornia Light, DO PGY-1, Psychiatry Residency  12/25/2023, 7:59  AM    I was present with the resident during in the interview, formulated the medical decision making  as well as participated in the exam on the patient.  I agree with the resident's findings above.  Dr. Lamar Slain, DO Psychiatrist

## 2023-12-25 NOTE — Group Note (Signed)
 LCSW Group Therapy Note   Group Date: 12/25/2023 Start Time: 1020 End Time: 1145   Type of Therapy and Topic:  Group Therapy: Boundaries  Participation Level:  Active  Description of Group: This group will address the use of boundaries in their personal lives. Patients will explore why boundaries are important, the difference between healthy and unhealthy boundaries, and negative and postive outcomes of different boundaries and will look at how boundaries can be crossed.  Patients will be encouraged to identify current boundaries in their own lives and identify what kind of boundary is being set. Facilitators will guide patients in utilizing problem-solving interventions to address and correct types boundaries being used and to address when no boundary is being used. Understanding and applying boundaries will be explored and addressed for obtaining and maintaining a balanced life. Patients will be encouraged to explore ways to assertively make their boundaries and needs known to significant others in their lives, using other group members and facilitator for role play, support, and feedback.  Therapeutic Goals:  1.  Patient will identify areas in their life where setting clear boundaries could be  used to improve their life.  2.  Patient will identify signs/triggers that a boundary is not being respected. 3.  Patient will identify two ways to set boundaries in order to achieve balance in  their lives: 4.  Patient will demonstrate ability to communicate their needs and set boundaries  through discussion and/or role plays  Summary of Patient Progress:  She was present/active throughout the session and proved open to feedback from CSW and peers. Patient demonstrated  insight into the subject matter, was respectful of peers, and was present throughout the entire session.  Therapeutic Modalities:   Cognitive Behavioral Therapy Solution-Focused Therapy  Golda Louder, LCSWA 12/25/2023  5:51 PM

## 2023-12-25 NOTE — Progress Notes (Incomplete)
(  Sleep Hours) - 7.75 (Any PRNs that were needed, meds refused, or side effects to meds)- hydroxyzine  (Any disturbances and when (visitation, over night)- none (Concerns raised by the patient)- none (SI/HI/AVH)- denies

## 2023-12-26 DIAGNOSIS — F1020 Alcohol dependence, uncomplicated: Secondary | ICD-10-CM

## 2023-12-26 DIAGNOSIS — F332 Major depressive disorder, recurrent severe without psychotic features: Principal | ICD-10-CM

## 2023-12-26 MED ORDER — PROPRANOLOL HCL 10 MG PO TABS: 10.0000 mg | ORAL_TABLET | Freq: Two times a day (BID) | ORAL | 0 refills | Status: AC

## 2023-12-26 MED ORDER — SERTRALINE HCL 100 MG PO TABS: 100.0000 mg | ORAL_TABLET | Freq: Every day | ORAL | 0 refills | Status: AC

## 2023-12-26 MED ORDER — TRAZODONE HCL 50 MG PO TABS: 50.0000 mg | ORAL_TABLET | Freq: Every evening | ORAL | 0 refills | Status: AC | PRN

## 2023-12-26 MED ORDER — HYDROXYZINE HCL 25 MG PO TABS: 25.0000 mg | ORAL_TABLET | Freq: Three times a day (TID) | ORAL | 0 refills | Status: AC | PRN

## 2023-12-26 NOTE — [facility] Suicide Risk Assessment (Signed)
 Suicide Risk Assessment  Discharge Assessment    [facility] Discharge Suicide Risk Assessment   Principal Problem: MDD (major depressive disorder), recurrent episode, severe (HCC) Discharge Diagnoses: Principal Problem:   MDD (major depressive disorder), recurrent episode, severe (HCC) Active Problems:   Alcohol use disorder, severe, dependence (HCC)   Total Time spent with patient: 20 minutes  Caitlin Brown is a 43 y.o. female  with a past psychiatric history of MDD and alcohol use disorder, severe. Patient initially arrived to [facility] on 10/17 for SI in the context of alcohol abuse, and admitted to [facility] on 10/22 for crisis stabalization.  No significant past medical history.   Hospital Course  During the patient's hospitalization, patient had extensive initial psychiatric evaluation, and follow-up psychiatric evaluations every day.  Psychiatric diagnoses provided upon initial assessment: Principal Problem:   MDD (major depressive disorder), recurrent episode, severe (HCC) Active Problems:   Alcohol use disorder, severe, dependence (HCC)  During the patient's hospitalization, patient had extensive initial psychiatric evaluation, and follow-up psychiatric evaluations every day.  Psychiatric diagnoses provided upon initial assessment: MDD, severe, recurrent, Alcohol use disorder  Patient's psychiatric medications were adjusted on admission: Increase home dose of Zoloft  from 50 mg to 100 mg daily for depression  During the hospitalization, other adjustments were made to the patient's psychiatric medication regimen: Added propanolol 10 mg BID for continued elevated BP and anxiety.   Patient's care was discussed during the interdisciplinary team meeting every day during the hospitalization.  The patient denied having side effects to prescribed psychiatric medication.  Gradually, patient started adjusting to milieu. The patient was evaluated each day by a clinical provider to  ascertain response to treatment. Improvement was noted by the patient's report of decreasing symptoms, improved sleep and appetite, affect, medication tolerance, behavior, and participation in unit programming.  Patient was asked each day to complete a self inventory noting mood, mental status, pain, new symptoms, anxiety and concerns.   Symptoms were reported as significantly decreased or resolved completely by discharge.  The patient reports that their mood is stable.  The patient denied having suicidal thoughts for more than 48 hours prior to discharge.  Patient denies having homicidal thoughts.  Patient denies having auditory hallucinations.  Patient denies any visual hallucinations or other symptoms of psychosis.  The patient was motivated to continue taking medication with a goal of continued improvement in mental health.   The patient reports their target psychiatric symptoms of depression responded well to the psychiatric medications, and the patient reports overall benefit other psychiatric hospitalization. Supportive psychotherapy was provided to the patient. The patient also participated in regular group therapy while hospitalized. Coping skills, problem solving as well as relaxation therapies were also part of the unit programming.  Labs were reviewed with the patient, and abnormal results were discussed with the patient.  The patient is able to verbalize their individual safety plan to this provider.  # It is recommended to the patient to continue psychiatric medications as prescribed, after discharge from the hospital.    # It is recommended to the patient to follow up with your outpatient psychiatric provider and PCP.  # It was discussed with the patient, the impact of alcohol, drugs, tobacco have been there overall psychiatric and medical wellbeing, and total abstinence from substance use was recommended the patient.ed.  # Prescriptions provided or sent directly to preferred  pharmacy at discharge. Patient agreeable to plan. Given opportunity to ask questions. Appears to feel comfortable with discharge.    #  In the event of worsening symptoms, the patient is instructed to call the crisis hotline, 911 and or go to the nearest ED for appropriate evaluation and treatment of symptoms. To follow-up with primary care provider for other medical issues, concerns and or health care needs  # Patient was discharged home with a plan to follow up as noted below.  On day of discharge, patient continues to be pleasant and cooperative with very mild psychomotor agitation noted.  Patient is going home and staying with her father and step mother.  She plans to attend virtual [facility] for the next 8 weeks.  We discussed options to help with patient's sobriety, including medication assisted treatment, rehabilitation, and working on treating her underlying depressive disorder to prevent relapse. Patient says she has excellent social supports, and hopes she will not need assistance in the future.  Denies all SI/HI/AVH today. Patient understands the medications she is taking, and why it is important to continue taking these.   Musculoskeletal: Strength & Muscle Tone: within normal limits Gait & Station: normal Patient leans: N/A  Psychiatric Specialty Exam  Appearance: Appears stated age, dressed appropriately, with good grooming and hygiene.  Behavior: pleasant and cooperative, very mild psychomotor agitation noted in the form of facial twitching  Attitude: Pleasant and polite  Speech: Normal rate and rate. Speech is coherent and goal-directed.  Mood: "Dealing good"  Affect: Congruent  Thought Process: Linear, goal-directed  Thought Content: No evidence of delusions, paranoia, or obsessions.   SI/HI: Denies  Perceptions: Denies AVH; no perceptual disturbances observed.  Judgement: Fair  Insight: Fair  Fund of Knowledge: WNL    Physical Exam: Physical Exam Constitutional:       Appearance: Normal appearance. She is normal weight. She is not ill-appearing or toxic-appearing.  Pulmonary:     Effort: Pulmonary effort is normal.  Neurological:     General: No focal deficit present.     Mental Status: She is alert.    Review of Systems  Psychiatric/Behavioral:  Negative for hallucinations and suicidal ideas.    Blood pressure 112/74, pulse 76, temperature 98.4 F (36.9 C), temperature source Oral, resp. rate 20, height 5' 6 (1.676 m), weight 66.2 kg, last menstrual period 12/14/2023, SpO2 100%. Body mass index is 23.57 kg/m.  Mental Status Per Nursing Assessment::   On Admission:  NA  Demographic Factors:  Living alone Loss Factors: Loss of significant relationship and Financial problems/change in socioeconomic status Historical Factors: NA Risk Reduction Factors:   Positive social support   Continued Clinical Symptoms:  Depression:   Comorbid alcohol abuse/dependence Alcohol/Substance Abuse/Dependencies  Cognitive Features That Contribute To Risk:  None    Suicide Risk:  Mild: There are no identifiable suicide plans, no associated intent, mild dysphoria and related symptoms, good self-control (both objective and subjective assessment), few other risk factors, and identifiable protective factors, including available and accessible social support. or Minimal: No identifiable suicidal ideation.  Patients presenting with no risk factors but with morbid ruminations; may be classified as minimal risk based on the severity of the depressive symptoms   Follow-up Information     [facility] Follow up on 12/28/2023.   Why: Please call this provider on 12/28/23 at 9:00 to schedule substance abuse intensive outpatient therapy and medication management services. They offer virtual evening sessions. They will need your current insurance information at this time. Contact information: [address] [place] [address] [phone]  [facility] - Inpatient Substance Use Treatment. Call.   Why: If you are interested in residential substance use treatment, you may call this provider to begin the admission process. They do accept BCBS health insurance. Contact information: Address: [address] [place], [place] [address]  Phone: [phone]                Plan Of Care/Follow-up recommendations:  Activity: as tolerated  Diet: heart healthy  Other: -Follow-up with your outpatient psychiatric provider -instructions on appointment date, time, and address (location) are provided to you in discharge paperwork.  -Take your psychiatric medications as prescribed at discharge - instructions are provided to you in the discharge paperwork  -Follow-up with outpatient primary care doctor and other specialists -for management of preventative medicine and chronic medical disease, including: n/a  -Testing: Follow-up with outpatient provider for abnormal lab results: n/a  -Recommend abstinence from alcohol, tobacco, and other illicit drug use at discharge.   -If your psychiatric symptoms recur, worsen, or if you have side effects to your psychiatric medications, call your outpatient psychiatric provider, 911, [phone] or go to the nearest emergency department.  -If suicidal thoughts recur, call your outpatient psychiatric provider, 911, [phone] or go to the nearest emergency department.   Alfornia Light, DO 12/26/2023, 7:40 AM

## 2023-12-26 NOTE — Progress Notes (Signed)
   12/26/23 0800  Psych Admission Type (Psych Patients Only)  Admission Status Voluntary  Psychosocial Assessment  Patient Complaints Anxiety  Eye Contact Fair  Facial Expression Animated;Anxious  Affect Appropriate to circumstance  Speech Logical/coherent  Interaction Assertive  Motor Activity Other (Comment) (WNL)  Appearance/Hygiene Unremarkable  Behavior Characteristics Appropriate to situation  Mood Anxious;Pleasant  Thought Process  Coherency WDL  Content WDL  Delusions None reported or observed  Perception WDL  Hallucination None reported or observed  Judgment Limited  Confusion None  Danger to Self  Current suicidal ideation? Denies  Agreement Not to Harm Self Yes  Description of Agreement verbal  Danger to Others  Danger to Others None reported or observed

## 2023-12-26 NOTE — Discharge Summary (Signed)
 Physician Discharge Summary Note  Patient:  Caitlin Brown is an 43 y.o., female MRN:  [id number] DOB:  November 06, 1980 Patient phone:  [phone] (home)  Patient address:   [address] [place] [address],   Date of Admission:  12/21/2023 Date of Discharge: 12/26/2023  Reason for Admission:  crisis stabilization  Principal Problem: MDD (major depressive disorder), recurrent episode, severe (HCC) Discharge Diagnoses: Principal Problem:   MDD (major depressive disorder), recurrent episode, severe (HCC) Active Problems:   Alcohol use disorder, severe, dependence (HCC)   Past Psychiatric History:  Prev Dx/Sx: Depression and anxiety Current Psych Provider: None Home Meds (current): Zoloft  Previous Med Trials: Zoloft  Therapy: Denies   Prior Psych Hospitalization: Denies  Prior Self Harm: Denies Prior Violence: Denies   Family Psych History: Denies Family Hx suicide: Denie  Past Medical History:  Past Medical History:  Diagnosis Date   Depression     Past Surgical History:  Procedure Laterality Date   CESAREAN SECTION     Family History: History reviewed. No pertinent family history. Family Psychiatric  History:  Family Psych History: Denies Family Hx suicide: Denies Social History:  Social History   Substance and Sexual Activity  Alcohol Use Yes   Alcohol/week: 28.0 standard drinks of alcohol   Types: 28 Glasses of wine per week   Comment: Daily. Last drink: today     Social History   Substance and Sexual Activity  Drug Use No    Social History   Socioeconomic History   Marital status: Married    Spouse name: Not on file   Number of children: Not on file   Years of education: Not on file   Highest education level: Not on file  Occupational History   Not on file  Tobacco Use   Smoking status: Former   Smokeless tobacco: Never  Substance and Sexual Activity   Alcohol use: Yes    Alcohol/week: 28.0 standard drinks of alcohol     Types: 28 Glasses of wine per week    Comment: Daily. Last drink: today   Drug use: No   Sexual activity: Not Currently  Other Topics Concern   Not on file  Social History Narrative   Not on file   Social Drivers of Health   Financial Resource Strain: Not on file  Food Insecurity: No Food Insecurity (12/21/2023)   Hunger Vital Sign    Worried About Running Out of Food in the Last Year: Never true    Ran Out of Food in the Last Year: Never true  Transportation Needs: No Transportation Needs (12/21/2023)   PRAPARE - Administrator, Civil Service (Medical): No    Lack of Transportation (Non-Medical): No  Physical Activity: Not on file  Stress: Not on file  Social Connections: Not on file   Hospital Course:   During the patient's hospitalization, patient had extensive initial psychiatric evaluation, and follow-up psychiatric evaluations every day.   Psychiatric diagnoses provided upon initial assessment: Principal Problem:   MDD (major depressive disorder), recurrent episode, severe (HCC) Active Problems:   Alcohol use disorder, severe, dependence (HCC)  During the patient's hospitalization, patient had extensive initial psychiatric evaluation, and follow-up psychiatric evaluations every day.   Psychiatric diagnoses provided upon initial assessment: MDD, severe, recurrent, Alcohol use disorder   Patient's psychiatric medications were adjusted on admission: Increase home dose of Zoloft  from 50 mg to 100 mg daily for depression   During the hospitalization, other adjustments were made to  the patient's psychiatric medication regimen: Added propanolol 10 mg BID for continued elevated BP and anxiety.    Patient's care was discussed during the interdisciplinary team meeting every day during the hospitalization.   The patient denied having side effects to prescribed psychiatric medication.   Gradually, patient started adjusting to milieu. The patient was evaluated each day  by a clinical provider to ascertain response to treatment. Improvement was noted by the patient's report of decreasing symptoms, improved sleep and appetite, affect, medication tolerance, behavior, and participation in unit programming.  Patient was asked each day to complete a self inventory noting mood, mental status, pain, new symptoms, anxiety and concerns.   Symptoms were reported as significantly decreased or resolved completely by discharge.  The patient reports that their mood is stable.  The patient denied having suicidal thoughts for more than 48 hours prior to discharge.  Patient denies having homicidal thoughts.  Patient denies having auditory hallucinations.  Patient denies any visual hallucinations or other symptoms of psychosis.  The patient was motivated to continue taking medication with a goal of continued improvement in mental health.    The patient reports their target psychiatric symptoms of depression responded well to the psychiatric medications, and the patient reports overall benefit other psychiatric hospitalization. Supportive psychotherapy was provided to the patient. The patient also participated in regular group therapy while hospitalized. Coping skills, problem solving as well as relaxation therapies were also part of the unit programming.   Labs were reviewed with the patient, and abnormal results were discussed with the patient.   The patient is able to verbalize their individual safety plan to this provider.   # It is recommended to the patient to continue psychiatric medications as prescribed, after discharge from the hospital.     # It is recommended to the patient to follow up with your outpatient psychiatric provider and PCP.   # It was discussed with the patient, the impact of alcohol, drugs, tobacco have been there overall psychiatric and medical wellbeing, and total abstinence from substance use was recommended the patient.ed.   # Prescriptions provided or  sent directly to preferred pharmacy at discharge. Patient agreeable to plan. Given opportunity to ask questions. Appears to feel comfortable with discharge.    # In the event of worsening symptoms, the patient is instructed to call the crisis hotline, 911 and or go to the nearest ED for appropriate evaluation and treatment of symptoms. To follow-up with primary care provider for other medical issues, concerns and or health care needs   # Patient was discharged home with a plan to follow up as noted below.   On day of discharge, patient continues to be pleasant and cooperative with very mild psychomotor agitation noted.  Patient is going home and staying with her father and step mother.  She plans to attend virtual [facility] for the next 8 weeks.    We discussed options to help with patient's sobriety, including medication assisted treatment, rehabilitation, and working on treating her underlying depressive disorder to prevent relapse. Patient says she has excellent social supports, and hopes she will not need assistance in the future.  Denies all SI/HI/AVH today. Patient understands the medications she is taking, and why it is important to continue taking these  Physical Findings: AIMS:  , ,  ,  ,  ,  ,   CIWA:  CIWA-Ar Total: 0 COWS:     Musculoskeletal: Strength & Muscle Tone: within normal limits Gait & Station: normal  Patient leans: N/A   Psychiatric Specialty Exam:  Presentation  General Appearance: Appropriate for Environment  Eye Contact:Good  Speech:Clear and Coherent; Normal Rate  Speech Volume:Normal  Handedness:No data recorded  Mood and Affect  Mood:Euthymic  Affect:Appropriate; Congruent   Thought Process  Thought Processes:Coherent; Linear; Goal Directed  Descriptions of Associations:Intact  Orientation:Full (Time, Place and Person)  Thought Content:Logical  History of Schizophrenia/Schizoaffective disorder:No data recorded Duration of Psychotic  Symptoms:No data recorded Hallucinations:Hallucinations: None  Ideas of Reference:None  Suicidal Thoughts:Suicidal Thoughts: No  Homicidal Thoughts:Homicidal Thoughts: No   Sensorium  Memory:Immediate Fair  Judgment:Poor  Insight:Shallow   Executive Functions  Concentration:Fair  Attention Span:Good  Recall:Good  Fund of Knowledge:Good  Language:Good   Psychomotor Activity  Psychomotor Activity:Psychomotor Activity: Other (comment) (facial twitching)   Assets  Assets:Physical Health; Desire for Improvement; Communication Skills; Resilience; Social Support   Sleep  Sleep:Sleep: Good  Estimated Sleeping Duration (Last 24 Hours): 6.00-7.25 hours   Physical Exam: Physical Exam Constitutional:      General: She is not in acute distress.    Appearance: Normal appearance. She is normal weight. She is not ill-appearing or toxic-appearing.  Pulmonary:     Effort: Pulmonary effort is normal.  Neurological:     General: No focal deficit present.     Mental Status: She is alert.  Psychiatric:        Mood and Affect: Mood normal.        Thought Content: Thought content normal.    Review of Systems  Constitutional:  Negative for diaphoresis.  Neurological:  Negative for tremors and headaches.  Psychiatric/Behavioral:  Negative for hallucinations and suicidal ideas.    Blood pressure 112/74, pulse 76, temperature 98.4 F (36.9 C), temperature source Oral, resp. rate 20, height 5' 6 (1.676 m), weight 66.2 kg, last menstrual period 12/14/2023, SpO2 100%. Body mass index is 23.57 kg/m.   Social History   Tobacco Use  Smoking Status Former  Smokeless Tobacco Never   Tobacco Cessation:  Prescription not provided because: not wanted  Blood Alcohol level:  Lab Results  Component Value Date   ETH 365 (HH) 12/17/2023   ETH 18 (H) 05/29/2016    Metabolic Disorder Labs:  No results found for: HGBA1C, MPG No results found for: PROLACTIN No results  found for: CHOL, TRIG, HDL, CHOLHDL, VLDL, LDLCALC  See Psychiatric Specialty Exam and Suicide Risk Assessment completed by Attending Physician prior to discharge.  Discharge destination:  Home  Is patient on multiple antipsychotic therapies at discharge:  No   Has Patient had three or more failed trials of antipsychotic monotherapy by history:  No  Recommended Plan for Multiple Antipsychotic Therapies: NA   Allergies as of 12/26/2023   No Known Allergies      Medication List     STOP taking these medications    acetaminophen  325 MG tablet Commonly known as: TYLENOL    ferrous sulfate 325 (65 FE) MG tablet   multivitamin with minerals Tabs tablet   thiamine  100 MG tablet Commonly known as: VITAMIN B1       TAKE these medications      Indication  hydrOXYzine  25 MG tablet Commonly known as: ATARAX  Take 1 tablet (25 mg total) by mouth 3 (three) times daily as needed for anxiety.  Indication: Feeling Anxious   propranolol 10 MG tablet Commonly known as: INDERAL Take 1 tablet (10 mg total) by mouth 2 (two) times daily.  Indication: Feeling Anxious   sertraline  100 MG tablet Commonly known  as: ZOLOFT  Take 1 tablet (100 mg total) by mouth at bedtime. What changed:  medication strength how much to take additional instructions  Indication: Major Depressive Disorder   traZODone 50 MG tablet Commonly known as: DESYREL Take 1 tablet (50 mg total) by mouth at bedtime as needed for sleep. What changed:  medication strength how much to take when to take this reasons to take this  Indication: Trouble Sleeping        Follow-up Information     [facility] Follow up on 12/28/2023.   Why: Please call this provider on 12/28/23 at 9:00 to schedule substance abuse intensive outpatient therapy and medication management services. They offer virtual evening sessions. They will need your current insurance information at this time. Contact  information: [address] [place] [address] [phone]         [facility] - [facility]. Call.   Why: If you are interested in residential substance use treatment, you may call this provider to begin the admission process. They do accept BCBS health insurance. Contact information: Address: [address] [place], [place] [address]  Phone: [phone]                Follow-up recommendations:   Activity: as tolerated  Diet: heart healthy  Other: -Follow-up with your outpatient psychiatric provider -instructions on appointment date, time, and address (location) are provided to you in discharge paperwork.  -Take your psychiatric medications as prescribed at discharge - instructions are provided to you in the discharge paperwork  -Follow-up with outpatient primary care doctor and other specialists -for management of chronic medical disease, including: AUD  -Testing: Follow-up with outpatient provider for abnormal lab results: n/a  -Recommend abstinence from alcohol, tobacco, and other illicit drug use at discharge.   -If your psychiatric symptoms recur, worsen, or if you have side effects to your psychiatric medications, call your outpatient psychiatric provider, 911, [phone] or go to the nearest emergency department.  -If suicidal thoughts recur, call your outpatient psychiatric provider, 911, [phone] or go to the nearest emergency department.   SignedBETHA Alfornia Light, DO 12/26/2023, 5:46 PM

## 2023-12-26 NOTE — [facility] Suicide Risk Assessment (Signed)
 [facility] INPATIENT:  Family/Significant Other Suicide Prevention Education  Suicide Prevention Education:  Suicide Prevention Education was reviewed thoroughly with patient, including risk factors, warning signs, and what to do.  Mobile Crisis services were described and that telephone number pointed out, with encouragement to patient to put this number in personal cell phone.  Brochure was provided to patient to share with natural supports.  Patient acknowledged the ways in which they are at risk, and how working through each of their issues can gradually start to reduce their risk factors.  Patient was encouraged to think of the information in the context of people in their own lives.  Patient denied having access to firearms  Patient verbalized understanding of information provided.  Patient endorsed a desire to live.   Golda Louder 12/26/2023 [phone]

## 2023-12-26 NOTE — Plan of Care (Signed)
 Pt has been exhibiting signs of emotional and behavioral stability displaying ready for discharge.

## 2023-12-26 NOTE — Progress Notes (Signed)
 Pt is excited to go home. Full of smiles and hope. Received AVS.

## 2023-12-26 NOTE — [facility] Suicide Risk Assessment (Signed)
 [facility] INPATIENT:  Family/Significant Other Suicide Prevention Education  Suicide Prevention Education:  Contact Attempts: Holley Linen (step mom) [phone], (name of family member/significant other) has been identified by the patient as the family member/significant other with whom the patient will be residing, and identified as the person(s) who will aid the patient in the event of a mental health crisis.  With written consent from the patient, two attempts were made to provide suicide prevention education, prior to and/or following the patient's discharge.  We were unsuccessful in providing suicide prevention education.  A suicide education pamphlet was given to the patient to share with family/significant other.  12/26/2023 0820  Golda Louder LCSWA  12/26/2023, 8:38 AM

## 2023-12-26 NOTE — Progress Notes (Signed)
 Coordination of transportation:    CSW coordinated transportation w/ [employer], transportation note completed and signed. Copy of transportation waiver signed by patient and faxed to medical records.    Vinod Mikesell LCSWA

## 2023-12-26 NOTE — Progress Notes (Signed)
  [facility] Adult Case Management Discharge Plan :  Will you be returning to the same living situation after discharge:  Yes,    At discharge, do you have transportation home?: No. CSW to coordinate [facility] Do you have the ability to pay for your medications: Yes,  PRIMARY INS: [employer] / [employer] 3-[employer]  Release of information consent forms completed and in the chart;  Patient's signature needed at discharge.  Patient to Follow up at:  Follow-up Information     [facility] Follow up on 12/28/2023.   Why: Please call this provider on 12/28/23 at 9:00 to schedule substance abuse intensive outpatient therapy and medication management services. They offer virtual evening sessions. They will need your current insurance information at this time. Contact information: [address] [place] [address] [phone]         [facility] - [facility]. Call.   Why: If you are interested in residential substance use treatment, you may call this provider to begin the admission process. They do accept BCBS health insurance. Contact information: Address: [address] [place], [place] [address]  Phone: [phone]                Next level of care provider has access to [facility] Link:no  Safety Planning and Suicide Prevention discussed: Yes,  w/ patient. Pt was given a SPE brochure   Has patient been referred to the Quitline?: Patient refused referral for treatment  Patient has been referred for addiction treatment: Yes patient will f[facility]  Golda Louder, LCSWA 12/26/2023, 8:51 AM

## 2024-01-17 ENCOUNTER — Other Ambulatory Visit (HOSPITAL_COMMUNITY): Payer: Self-pay
